# Patient Record
Sex: Female | Born: 1975 | Race: White | Hispanic: No | Marital: Married | Smoking: Never smoker
Health system: Southern US, Community
[De-identification: ages and names within clinical notes are randomized; demographics above are authoritative.]

## PROBLEM LIST (undated history)

## (undated) DIAGNOSIS — G43909 Migraine, unspecified, not intractable, without status migrainosus: Secondary | ICD-10-CM

## (undated) DIAGNOSIS — J45909 Unspecified asthma, uncomplicated: Secondary | ICD-10-CM

## (undated) DIAGNOSIS — Z1371 Encounter for nonprocreative screening for genetic disease carrier status: Secondary | ICD-10-CM

## (undated) DIAGNOSIS — I739 Peripheral vascular disease, unspecified: Secondary | ICD-10-CM

## (undated) HISTORY — PX: MOLE REMOVAL: SHX2046

## (undated) HISTORY — DX: Migraine, unspecified, not intractable, without status migrainosus: G43.909

## (undated) HISTORY — PX: TONSILLECTOMY: SUR1361

## (undated) HISTORY — DX: Encounter for nonprocreative screening for genetic disease carrier status: Z13.71

---

## 2004-12-31 ENCOUNTER — Ambulatory Visit (HOSPITAL_COMMUNITY): Admission: RE | Admit: 2004-12-31 | Discharge: 2004-12-31 | Payer: Self-pay | Admitting: Gynecology

## 2005-03-05 ENCOUNTER — Ambulatory Visit (HOSPITAL_COMMUNITY): Admission: RE | Admit: 2005-03-05 | Discharge: 2005-03-05 | Payer: Self-pay | Admitting: Gynecology

## 2005-05-25 ENCOUNTER — Inpatient Hospital Stay (HOSPITAL_COMMUNITY): Admission: AD | Admit: 2005-05-25 | Discharge: 2005-05-27 | Payer: Self-pay | Admitting: Gynecology

## 2007-02-24 ENCOUNTER — Ambulatory Visit: Payer: Self-pay | Admitting: Obstetrics & Gynecology

## 2012-12-29 DIAGNOSIS — G43909 Migraine, unspecified, not intractable, without status migrainosus: Secondary | ICD-10-CM

## 2012-12-29 HISTORY — DX: Migraine, unspecified, not intractable, without status migrainosus: G43.909

## 2013-03-29 DIAGNOSIS — Z1371 Encounter for nonprocreative screening for genetic disease carrier status: Secondary | ICD-10-CM

## 2013-03-29 HISTORY — DX: Encounter for nonprocreative screening for genetic disease carrier status: Z13.71

## 2016-05-29 ENCOUNTER — Encounter: Payer: Self-pay | Admitting: Emergency Medicine

## 2016-05-29 DIAGNOSIS — N939 Abnormal uterine and vaginal bleeding, unspecified: Secondary | ICD-10-CM | POA: Diagnosis present

## 2016-05-29 DIAGNOSIS — N920 Excessive and frequent menstruation with regular cycle: Secondary | ICD-10-CM | POA: Insufficient documentation

## 2016-05-29 LAB — CBC
HEMATOCRIT: 40.4 % (ref 35.0–47.0)
Hemoglobin: 13.3 g/dL (ref 12.0–16.0)
MCH: 26.6 pg (ref 26.0–34.0)
MCHC: 33.1 g/dL (ref 32.0–36.0)
MCV: 80.3 fL (ref 80.0–100.0)
Platelets: 242 10*3/uL (ref 150–440)
RBC: 5.02 MIL/uL (ref 3.80–5.20)
RDW: 13.8 % (ref 11.5–14.5)
WBC: 10.8 10*3/uL (ref 3.6–11.0)

## 2016-05-29 LAB — BASIC METABOLIC PANEL
Anion gap: 9 (ref 5–15)
BUN: 17 mg/dL (ref 6–20)
CALCIUM: 9.3 mg/dL (ref 8.9–10.3)
CO2: 30 mmol/L (ref 22–32)
CREATININE: 0.78 mg/dL (ref 0.44–1.00)
Chloride: 99 mmol/L — ABNORMAL LOW (ref 101–111)
GFR calc non Af Amer: >60 mL/min (ref >60)
Glucose, Bld: 96 mg/dL (ref 65–99)
Potassium: 3.8 mmol/L (ref 3.5–5.1)
SODIUM: 138 mmol/L (ref 135–145)

## 2016-05-29 LAB — POCT PREGNANCY, URINE: Preg Test, Ur: NEGATIVE

## 2016-05-29 NOTE — ED Notes (Signed)
Vaginal bleeding.  Onset last PM.  C/O lower abdominal cramping.  Last menstral period 2 weeks ago.  States bleeding is heavy.  Over one hour patient has bled through a super plus tampon and large pad.  Also passing clots.

## 2016-05-30 ENCOUNTER — Emergency Department
Admission: EM | Admit: 2016-05-30 | Discharge: 2016-05-30 | Disposition: A | Discharge status: Home / Self Care | Payer: Managed Care, Other (non HMO) | Attending: Emergency Medicine | Admitting: Emergency Medicine

## 2016-05-30 ENCOUNTER — Emergency Department: Payer: Managed Care, Other (non HMO)

## 2016-05-30 DIAGNOSIS — N920 Excessive and frequent menstruation with regular cycle: Secondary | ICD-10-CM

## 2016-05-30 MED ORDER — OXYCODONE-ACETAMINOPHEN 5-325 MG PO TABS
1.0000 | ORAL_TABLET | Freq: Once | ORAL | Status: AC
  Administered 2016-05-30: 1 via ORAL

## 2016-05-30 MED ORDER — MEDROXYPROGESTERONE ACETATE 10 MG PO TABS: 20.0000 mg | ORAL_TABLET | Freq: Three times a day (TID) | ORAL | Status: DC

## 2016-05-30 MED ORDER — OXYCODONE-ACETAMINOPHEN 5-325 MG PO TABS
ORAL_TABLET | ORAL | Status: AC
  Administered 2016-05-30: 1 via ORAL
  Filled 2016-05-30: qty 1

## 2016-05-30 MED ORDER — MEDROXYPROGESTERONE ACETATE 10 MG PO TABS
20.0000 mg | ORAL_TABLET | Freq: Once | ORAL | Status: AC
  Administered 2016-05-30: 20 mg via ORAL
  Filled 2016-05-30: qty 2

## 2016-05-30 NOTE — ED Notes (Signed)
Pt reports having bleeding since yesterday that is much heavier than her normal periods.  Pt states that she has saturated 7-10 super plus tampons in the past 24 hours.  Pt reports having discomfort in lower abdomen as well.  Pt instructed to change clothing and is awaiting ultrasound tech at this time.

## 2016-05-30 NOTE — ED Provider Notes (Signed)
[facility] Emergency Department Provider Note  ____________________________________________  Time seen: 12:15 AM  I have reviewed the triage vital signs and the nursing notes.   HISTORY  Chief Complaint Vaginal Bleeding      HPI Diane Reynolds is a 40 y.o. female presents with heavy vaginal bleeding times one day. Patient states she's contrary 8 or 9 superabsorbent tampons today as well as pads. Patient denies any weakness numbness gait instability. Patient does admit to low abdominal cramping. Patient admits to 20 year history of birth control which she discontinued approximately 9 months ago.    Past medical history No pertinent past medical history There are no active problems to display for this patient.   Past surgical history None No current outpatient prescriptions on file.  Allergies Azithromycin  No family history on file.  Social History Social History  Substance Use Topics  . Smoking status: Never Smoker   . Smokeless tobacco: None  . Alcohol Use: None    Review of Systems  Constitutional: Negative for fever. Eyes: Negative for visual changes. ENT: Negative for sore throat. Cardiovascular: Negative for chest pain. Respiratory: Negative for shortness of breath. Gastrointestinal: Negative for abdominal pain, vomiting and diarrhea. Genitourinary: Negative for dysuria. Musculoskeletal: Negative for back pain. Skin: Negative for rash. Neurological: Negative for headaches, focal weakness or numbness.   10-point ROS otherwise negative.  ____________________________________________   PHYSICAL EXAM:  VITAL SIGNS: ED Triage Vitals  Enc Vitals Group     BP 05/29/16 2235 124/57 mmHg     Pulse Rate 05/29/16 2235 90     Resp 05/29/16 2235 16     Temp 05/29/16 2235 98.6 F (37 C)     Temp Source 05/29/16 2235 Oral     SpO2 05/29/16 2235 98 %     Weight 05/29/16 2235 234 lb (106.142 kg)     Height 05/29/16 2235 5\' 8"   (1.727 m)     Head Cir --      Peak Flow --      Pain Score 05/29/16 2237 6     Pain Loc --      Pain Edu? --      Excl. in [place]? --      Constitutional: Alert and oriented. Well appearing and in no distress. Eyes: Conjunctivae are normal. PERRL. Normal extraocular movements. ENT   Head: Normocephalic and atraumatic.   Nose: No congestion/rhinnorhea.   Mouth/Throat: Mucous membranes are moist.   Neck: No stridor. Hematological/Lymphatic/Immunilogical: No cervical lymphadenopathy. Cardiovascular: Normal rate, regular rhythm. Normal and symmetric distal pulses are present in all extremities. No murmurs, rubs, or gallops. Respiratory: Normal respiratory effort without tachypnea nor retractions. Breath sounds are clear and equal bilaterally. No wheezes/rales/rhonchi. Gastrointestinal: Soft and nontender. No distention. There is no CVA tenderness. Genitourinary: deferred Musculoskeletal: Nontender with normal range of motion in all extremities. No joint effusions.  No lower extremity tenderness nor edema. Neurologic:  Normal speech and language. No gross focal neurologic deficits are appreciated. Speech is normal.  Skin:  Skin is warm, dry and intact. No rash noted. Psychiatric: Mood and affect are normal. Speech and behavior are normal. Patient exhibits appropriate insight and judgment.  ____________________________________________    LABS (pertinent positives/negatives)  Labs Reviewed  BASIC METABOLIC PANEL - Abnormal; Notable for the following:    Chloride 99 (*)    All other components within normal limits  CBC  POC URINE PREG, ED  POCT PREGNANCY, URINE      RADIOLOGY  US  Pelvis Complete (Final result) Result time: 05/30/16 01:35:32   Final result by Rad Results In Interface (05/30/16 01:35:32)   Narrative:   CLINICAL DATA: Menorrhagia.  EXAM: TRANSABDOMINAL AND TRANSVAGINAL ULTRASOUND OF PELVIS  TECHNIQUE: Both transabdominal and transvaginal  ultrasound examinations of the pelvis were performed. Transabdominal technique was performed for global imaging of the pelvis including uterus, ovaries, adnexal regions, and pelvic cul-de-sac. It was necessary to proceed with endovaginal exam following the transabdominal exam to visualize the uterus, endometrium, ovaries and adnexa.  COMPARISON: None  FINDINGS: Uterus  Measurements: 8.5 x 4.0 x 4.8 cm. No fibroids or other mass visualized.  Endometrium  Thickness: 16.5 mm, mildly thickened and heterogeneous. No focal abnormality visualized.  Right ovary  Measurements: 4.3 x 2.1 x 2.7 cm. Normal appearance/no adnexal mass. Physiologic follicles, normal blood flow.  Left ovary  Measurements: 3.5 x 2.0 x 2.6 cm. Normal appearance/no adnexal mass. Physiologic follicles, normal blood flow.  Other findings  No abnormal free fluid.  IMPRESSION: 1. Thickened heterogeneous endometrium. If bleeding remains unresponsive to hormonal or medical therapy, focal lesion work-up with sonohysterogram should be considered. Endometrial biopsy should also be considered in pre-menopausal patients at high risk for endometrial carcinoma. (Ref: Radiological Reasoning: Algorithmic Workup of Abnormal Vaginal Bleeding with Endovaginal Sonography and Sonohysterography. AJR 2008; [id number]) 2. Normal sonographic appearance of both ovaries.   Electronically Signed By: Jeb Levering M.D. On: 05/30/2016 01:35          US Transvaginal Non-OB (Final result) Result time: 05/30/16 01:35:32   Final result by Rad Results In Interface (05/30/16 01:35:32)   Narrative:   CLINICAL DATA: Menorrhagia.  EXAM: TRANSABDOMINAL AND TRANSVAGINAL ULTRASOUND OF PELVIS  TECHNIQUE: Both transabdominal and transvaginal ultrasound examinations of the pelvis were performed. Transabdominal technique was performed for global imaging of the pelvis including uterus, ovaries, adnexal regions, and  pelvic cul-de-sac. It was necessary to proceed with endovaginal exam following the transabdominal exam to visualize the uterus, endometrium, ovaries and adnexa.  COMPARISON: None  FINDINGS: Uterus  Measurements: 8.5 x 4.0 x 4.8 cm. No fibroids or other mass visualized.  Endometrium  Thickness: 16.5 mm, mildly thickened and heterogeneous. No focal abnormality visualized.  Right ovary  Measurements: 4.3 x 2.1 x 2.7 cm. Normal appearance/no adnexal mass. Physiologic follicles, normal blood flow.  Left ovary  Measurements: 3.5 x 2.0 x 2.6 cm. Normal appearance/no adnexal mass. Physiologic follicles, normal blood flow.  Other findings  No abnormal free fluid.  IMPRESSION: 1. Thickened heterogeneous endometrium. If bleeding remains unresponsive to hormonal or medical therapy, focal lesion work-up with sonohysterogram should be considered. Endometrial biopsy should also be considered in pre-menopausal patients at high risk for endometrial carcinoma. (Ref: Radiological Reasoning: Algorithmic Workup of Abnormal Vaginal Bleeding with Endovaginal Sonography and Sonohysterography. AJR 2008; [id number]) 2. Normal sonographic appearance of both ovaries.   Electronically Signed By: Jeb Levering M.D. On: 05/30/2016 01:35           INITIAL IMPRESSION / ASSESSMENT AND PLAN / ED COURSE  Pertinent labs & imaging results that were available during my care of the patient were reviewed by me and considered in my medical decision making (see chart for details).  Patient discussed with Dr. Georgianne Fick GYN on call recommendation for Provera 20 mg 3 times a day times one week. Patient will follow up with Dr. Georgianne Fick in one week. I discussed this with the patient and her husband who are in agreement with plan.  ____________________________________________   FINAL CLINICAL IMPRESSION(S) / ED DIAGNOSES  Final diagnoses:  Menorrhagia      Gregor Hams, MD 05/30/16  [phone]

## 2016-05-30 NOTE — ED Notes (Signed)
Pt discharged to home.  Family member driving.  Discharge instructions reviewed.  Verbalized understanding.  No questions or concerns at this time.  Teach back verified.  Pt in NAD.  No items left in ED.   

## 2016-05-30 NOTE — Discharge Instructions (Signed)

## 2017-03-02 ENCOUNTER — Ambulatory Visit (INDEPENDENT_AMBULATORY_CARE_PROVIDER_SITE_OTHER): Payer: Managed Care, Other (non HMO) | Admitting: Obstetrics and Gynecology

## 2017-03-02 ENCOUNTER — Encounter: Payer: Self-pay | Admitting: Obstetrics and Gynecology

## 2017-03-02 VITALS — BP 110/66 | HR 63 | Ht 68.0 in | Wt 231.0 lb

## 2017-03-02 DIAGNOSIS — E669 Obesity, unspecified: Secondary | ICD-10-CM

## 2017-03-02 DIAGNOSIS — Z6835 Body mass index (BMI) 35.0-35.9, adult: Secondary | ICD-10-CM

## 2017-03-02 MED ORDER — PHENTERMINE HCL 37.5 MG PO TABS: 37.5000 mg | ORAL_TABLET | Freq: Every day | ORAL | 0 refills | Status: DC

## 2017-03-02 NOTE — Progress Notes (Signed)
Gynecology Office Visit  Chief Complaint:  Chief Complaint  Patient presents with  . Weight Check    History of Present Illness: [name] a 41 y.o. G4P2002 female, who presents for the evaluation of the desire to lose weight. She has lost 10 pounds. The patient states the following symptoms since starting her weight loss therapy: appetite suppression, energy, and weight loss.  The patient also reports no other ill effects. The patient specifically denies heart palpitations, anxiety, and insomnia.   Review of Systems: Review of Systems  Constitutional: Positive for weight loss. Negative for diaphoresis and malaise/fatigue.  Cardiovascular: Negative for chest pain, palpitations and orthopnea.  Gastrointestinal: Negative for abdominal pain, constipation and diarrhea.  Endo/Heme/Allergies: Positive for polydipsia.  Psychiatric/Behavioral: Negative for depression. The patient is not nervous/anxious.     Past Medical History:  Past Medical History:  Diagnosis Date  . BRCA negative   . Migraine 2014    Past Surgical History:  Past Surgical History:  Procedure Laterality Date  . MOLE REMOVAL  08/2009,09/2009   Biopsy compound Nevus w/slight-moderate melanocyte Atypia pubic region, excision for negative margins  . TONSILLECTOMY      Gynecologic History: Patient's last menstrual period was 05/30/2016.  Obstetric History: [id number]  Family History:  Family History  Problem Relation Age of Onset  . Breast cancer Mother 37  . Melanoma Mother 19    Malignant skin  . Breast cancer Maternal Aunt 87  . Ovarian cancer Other 19    Social History:  Social History   Social History  . Marital status: Married    Spouse name: N/A  . Number of children: N/A  . Years of education: N/A   Occupational History  . Not on file.   Social History Main Topics  . Smoking status: Never Smoker  . Smokeless tobacco: Never Used  . Alcohol use Not on file  . Drug use: No  . Sexual  activity: Yes   Other Topics Concern  . Not on file   Social History Narrative  . No narrative on file    Allergies:  Allergies  Allergen Reactions  . Azithromycin Anaphylaxis    Medications: Prior to Admission medications   Medication Sig Start Date End Date Taking? Authorizing Provider  B Complex-C (B-COMPLEX WITH VITAMIN C) tablet Take 1 tablet by mouth daily.    Historical Provider, MD  cholecalciferol (VITAMIN D) 400 units TABS tablet Take 400 Units by mouth.    Historical Provider, MD  levonorgestrel (MIRENA) 20 MCG/24HR IUD 1 each by Intrauterine route once.    Historical Provider, MD  phentermine (ADIPEX-P) 37.5 MG tablet Take 1 tablet (37.5 mg total) by mouth daily before breakfast. 03/02/17   [name], MD    Physical Exam Vitals:  Vitals:   03/02/17 1156  BP: 110/66  Pulse: 63   Patient's last menstrual period was 05/30/2016.  General: NAD HEENT: normocephalic, anicteric Thyroid: no enlargement Pulmonary: no increased work of breathing Neurologic: Grossly intact Psychiatric: mood appropriate, affect full  Assessment: 42 y.o. [id number] No problem-specific Assessment & Plan notes found for this encounter.   Plan: Problem List Items Addressed This Visit      Other   Obesity (BMI 35.0-39.9 without comorbidity) - Primary   Relevant Medications   phentermine (ADIPEX-P) 37.5 MG tablet    Other Visit Diagnoses    BMI 35.0-35.9,adult          1) 1800 Calorie ADA Diet  2) Patient education given regarding appropriate lifestyle  changes for weight loss including: regular physical activity, healthy coping strategies, caloric restriction and healthy eating patterns.  3) Patient will be started on weight loss medication. The risks and benefits and side effects of medication, such as Adipex (Phenteramine) ,  Tenuate (Diethylproprion), Belviq (lorcarsin), Contrave (buproprion/naltrexone), Qsymia (phentermine/topiramate), and Saxenda (liraglutide) is discussed.  The pros and cons of suppressing appetite and boosting metabolism is discussed. Risks of tolerence and addiction is discussed for selected agents discussed. Use of medicine will ne short term, such as 3-4 months at a time followed by a period of time off of the medicine to avoid these risks and side effects for Adipex, Qsymia, and Tenuate discussed. Pt to call with any negative side effects and agrees to keep follow up appts.  4) Patient to take medication, with the benefits of appetite suppression and metabolism boost d/w pt, along with the side effects and risk factors of long term use that will be avoided with our use of short bursts of therapy. Rx provided.    5) 15 minutes face-to-face; with counseling/coordination of care > 50 percent of visit related to obesity and ongoing management/treatment   6) Follow up in 4 weeks to assess response

## 2017-03-02 NOTE — Patient Instructions (Signed)

## 2017-04-08 ENCOUNTER — Encounter: Payer: Self-pay | Admitting: Obstetrics and Gynecology

## 2017-04-08 ENCOUNTER — Ambulatory Visit (INDEPENDENT_AMBULATORY_CARE_PROVIDER_SITE_OTHER): Payer: Managed Care, Other (non HMO) | Admitting: Obstetrics and Gynecology

## 2017-04-08 VITALS — BP 108/72 | HR 66 | Ht 68.5 in | Wt 226.0 lb

## 2017-04-08 DIAGNOSIS — N946 Dysmenorrhea, unspecified: Secondary | ICD-10-CM

## 2017-04-08 DIAGNOSIS — E669 Obesity, unspecified: Secondary | ICD-10-CM | POA: Diagnosis not present

## 2017-04-08 DIAGNOSIS — Z6833 Body mass index (BMI) 33.0-33.9, adult: Secondary | ICD-10-CM

## 2017-04-08 DIAGNOSIS — N920 Excessive and frequent menstruation with regular cycle: Secondary | ICD-10-CM | POA: Diagnosis not present

## 2017-04-08 MED ORDER — PHENTERMINE HCL 37.5 MG PO TABS: 37.5000 mg | ORAL_TABLET | Freq: Every day | ORAL | 0 refills | Status: DC

## 2017-04-08 NOTE — Progress Notes (Signed)
Gynecology Office Visit  Chief Complaint:  Chief Complaint  Patient presents with  . Weight Check    History of Present Illness: [name] a 41 y.o. G72P2002 female, who presents for the evaluation of the desire to lose weight. She has lost 5 pounds for 2 month total of 15lbs. The patient states the following symptoms since starting her weight loss therapy: appetite suppression, energy, and weight loss.  The patient also reports no other ill effects. The patient specifically denies heart palpitations, anxiety, and insomnia.   She is still having menorrhagia and dysmenorrhea despite having an appropriately position Mirena IUD, however, with weight loss states has noted some improvement in overall flow and discomfort.  Review of Systems: 10 point review of systems negative unless otherwise noted in HPI  Past Medical History:  Past Medical History:  Diagnosis Date  . BRCA negative   . Migraine 2014    Past Surgical History:  Past Surgical History:  Procedure Laterality Date  . MOLE REMOVAL  08/2009,09/2009   Biopsy compound Nevus w/slight-moderate melanocyte Atypia pubic region, excision for negative margins  . TONSILLECTOMY      Gynecologic History: Patient's last menstrual period was 03/16/2017 (exact date).  Obstetric History: [id number]  Family History:  Family History  Problem Relation Age of Onset  . Breast cancer Mother 14  . Melanoma Mother 7    Malignant skin  . Breast cancer Maternal Aunt 66  . Ovarian cancer Other 60    Social History:  Social History   Social History  . Marital status: Married    Spouse name: N/A  . Number of children: N/A  . Years of education: N/A   Occupational History  . Not on file.   Social History Main Topics  . Smoking status: Never Smoker  . Smokeless tobacco: Never Used  . Alcohol use Not on file  . Drug use: No  . Sexual activity: Yes    Birth control/ protection: IUD   Other Topics Concern  . Not on file    Social History Narrative  . No narrative on file    Allergies:  Allergies  Allergen Reactions  . Azithromycin Anaphylaxis    Medications: Prior to Admission medications   Medication Sig Start Date End Date Taking? Authorizing Provider  B Complex-C (B-COMPLEX WITH VITAMIN C) tablet Take 1 tablet by mouth daily.    Historical Provider, MD  cholecalciferol (VITAMIN D) 400 units TABS tablet Take 400 Units by mouth.    Historical Provider, MD  levonorgestrel (MIRENA) 20 MCG/24HR IUD 1 each by Intrauterine route once.    Historical Provider, MD  phentermine (ADIPEX-P) 37.5 MG tablet Take 1 tablet (37.5 mg total) by mouth daily before breakfast. 03/02/17   [name], MD    Physical Exam Vitals:  Vitals:   04/08/17 0809  BP: 108/72  Pulse: 66   Patient's last menstrual period was 03/16/2017 (exact date). Filed Weights   04/08/17 0809  Weight: 226 lb (102.5 kg)   Body mass index is 33.86 kg/m.   General: NAD HEENT: normocephalic, anicteric Thyroid: no enlargement Pulmonary: no increased work of breathing Neurologic: Grossly intact Psychiatric: mood appropriate, affect full  Assessment: 41 y.o. [id number] No problem-specific Assessment & Plan notes found for this encounter.   Plan: Problem List Items Addressed This Visit      Other   Obesity (BMI 35.0-39.9 without comorbidity) - Primary   Relevant Medications   phentermine (ADIPEX-P) 37.5 MG tablet    Other Visit  Diagnoses    BMI 33.0-33.9,adult       Menorrhagia with regular cycle       Dysmenorrhea          1) 1500 Calorie ADA Diet  2) Patient education given regarding appropriate lifestyle changes for weight loss including: regular physical activity, healthy coping strategies, caloric restriction and healthy eating patterns.  3) Patient will be started on weight loss medication. The risks and benefits and side effects of medication, such as Adipex (Phenteramine) ,  Tenuate (Diethylproprion), Belviq  (lorcarsin), Contrave (buproprion/naltrexone), Qsymia (phentermine/topiramate), and Saxenda (liraglutide) is discussed. The pros and cons of suppressing appetite and boosting metabolism is discussed. Risks of tolerence and addiction is discussed for selected agents discussed. Use of medicine will ne short term, such as 3-4 months at a time followed by a period of time off of the medicine to avoid these risks and side effects for Adipex, Qsymia, and Tenuate discussed. Pt to call with any negative side effects and agrees to keep follow up appts.  4) Patient to take medication, with the benefits of appetite suppression and metabolism boost d/w pt, along with the side effects and risk factors of long term use that will be avoided with our use of short bursts of therapy. Rx provided.    5) 15 minutes face-to-face; with counseling/coordination of care > 50 percent of visit related to obesity and ongoing management/treatment   6) Follow up in 4 weeks to assess response, Patient would also like to go ahead and schedule TL[facility] for her continued menstrual issues which have been unresponsive to medical therapy.

## 2017-05-06 ENCOUNTER — Ambulatory Visit: Payer: Managed Care, Other (non HMO) | Admitting: Obstetrics and Gynecology

## 2017-06-29 ENCOUNTER — Encounter: Payer: Managed Care, Other (non HMO) | Admitting: Obstetrics and Gynecology

## 2017-06-29 ENCOUNTER — Other Ambulatory Visit: Payer: Managed Care, Other (non HMO)

## 2017-07-14 ENCOUNTER — Ambulatory Visit: Admit: 2017-07-14 | Payer: Managed Care, Other (non HMO) | Admitting: Obstetrics and Gynecology

## 2017-07-14 SURGERY — HYSTERECTOMY, TOTAL, LAPAROSCOPIC
Anesthesia: Choice

## 2018-07-13 ENCOUNTER — Encounter (INDEPENDENT_AMBULATORY_CARE_PROVIDER_SITE_OTHER): Payer: Self-pay | Admitting: Vascular Surgery

## 2018-07-13 ENCOUNTER — Ambulatory Visit (INDEPENDENT_AMBULATORY_CARE_PROVIDER_SITE_OTHER): Payer: Managed Care, Other (non HMO) | Admitting: Vascular Surgery

## 2018-07-13 VITALS — BP 97/59 | HR 67 | Resp 18 | Ht 68.0 in | Wt 231.8 lb

## 2018-07-13 DIAGNOSIS — D1800 Hemangioma unspecified site: Secondary | ICD-10-CM

## 2018-07-13 DIAGNOSIS — M79604 Pain in right leg: Secondary | ICD-10-CM

## 2018-07-13 DIAGNOSIS — M79609 Pain in unspecified limb: Secondary | ICD-10-CM | POA: Insufficient documentation

## 2018-07-13 DIAGNOSIS — M79605 Pain in left leg: Secondary | ICD-10-CM | POA: Diagnosis not present

## 2018-07-13 NOTE — Assessment & Plan Note (Signed)
The patient has multiple, symptomatic glomus tumors as described above.  We have discussed with her that the primary treatment for these as resection.  Injections can be considered, but tend to not be as reliably effective.  We have discussed that any ones that are causing significant symptoms can be resected.  These are generally benign and do not need to be resected if they are asymptomatic.  She desires to have these removed.  I have asked her to mark the ones that she would like to have removed and she says she thinks she would like the right shoulder, the right wrist and forearm, the left posterior knee, and the right posterior thigh lesions removed.  This would be done with anesthesia at the hospital.  This would be an outpatient procedure.  Risks and benefits were discussed.

## 2018-07-13 NOTE — Assessment & Plan Note (Signed)
The patient describes symptoms that are consistent with venous insufficiency of the lower extremities.  We discussed the pathophysiology and natural history of venous disease.  We discussed compression, elevation, and anti-inflammatories as needed for pain.  We can check a venous reflux study in several months to assess her venous anatomy and determine further treatment options.  We told her that this is separate from her glomus tumor issues.

## 2018-07-13 NOTE — Progress Notes (Signed)
Patient ID: [name], female   DOB: [birth year], 42 y.o.   MRN: [id number]  Chief Complaint  Patient presents with  . New Patient (Initial Visit)    ref for glomus tumor   HPI Diane Reynolds is a 42 y.o. female.  I am asked to see the patient by Dr. 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  The patient reports multiple small superficial lesions that are painful and look like bruises.  She says she had a father and an uncle who had glomus tumors and her dermatologist told her that switch she had as well.  The one on her right shoulder is very painful.  She also has painful ones on her right posterior thigh, left posterior knee, as well as her right wrist and forearm.  She says she has a lot of little tiny ones that are not that painful.  She has never had these resected.  She has had consideration for that for many years.  No episodes of bleeding.  These are not dramatically enlarging. The patient also reports discoloration and swelling in her legs bilaterally.  She has noticed this more with more activity and being on her feet more.  This has been a steadily progressive problem over months to years.  No history of ulceration or infection.  No history of DVT or superficial thrombophlebitis.   Past Medical History:  Diagnosis Date  . BRCA negative   . Migraine 2014    Past Surgical History:  Procedure Laterality Date  . MOLE REMOVAL  08/2009,09/2009   Biopsy compound Nevus w/slight-moderate melanocyte Atypia pubic region, excision for negative margins  . TONSILLECTOMY      Family History  Problem Relation Age of Onset  . Breast cancer Mother 19  . Melanoma Mother 73       Malignant skin  . Breast cancer Maternal Aunt 55  . Ovarian cancer Other 25  Father and uncle with glomus tumors  Social History Social History   Tobacco Use  . Smoking status: Never Smoker  . Smokeless tobacco: Never Used  Substance Use Topics  . Alcohol use: Not on file  . Drug use: No      Allergies  Allergen Reactions  . Azithromycin Anaphylaxis    Current Outpatient Medications  Medication Sig Dispense Refill  . B Complex-C (B-COMPLEX WITH VITAMIN C) tablet Take 1 tablet by mouth daily.    . cholecalciferol (VITAMIN D) 400 units TABS tablet Take 400 Units by mouth.    . levonorgestrel (MIRENA) 20 MCG/24HR IUD 1 each by Intrauterine route once.    . TESTOSTERONE COMPOUNDING KIT 20 % CREA Place onto the skin daily.    . phentermine (ADIPEX-P) 37.5 MG tablet Take 1 tablet (37.5 mg total) by mouth daily before breakfast. (Patient not taking: Reported on 07/13/2018) 30 tablet 0   No current facility-administered medications for this visit.       REVIEW OF SYSTEMS (Negative unless checked)  Constitutional: _0 Weight loss  _1 Fever  _2 Chills Cardiac: _3 Chest pain   _4 Chest pressure   _5 Palpitations   _6 Shortness of breath when laying flat   _7 Shortness of breath at rest   _8 Shortness of breath with exertion. Vascular:  _9 Pain in legs with walking   _10 Pain in legs at rest   _11 Pain in legs when laying flat   _12 Claudication   _13 Pain in feet when walking  _14 Pain in feet at rest  _15 Pain in feet when laying flat   _16 History of DVT   _17   Phlebitis   _0 Swelling in legs   _1 Varicose veins   _2 Non-healing ulcers Pulmonary:   _3 Uses home oxygen   _4 Productive cough   _5 Hemoptysis   _6 Wheeze  _7 COPD   _8 Asthma Neurologic:  _9 Dizziness  _10 Blackouts   _11 Seizures   _12 History of stroke   _13 History of TIA  _14 Aphasia   _15 Temporary blindness   _16 Dysphagia   _17 Weakness or numbness in arms   _18 Weakness or numbness in legs Musculoskeletal:  _19 Arthritis   _20 Joint swelling   _21 Joint pain   _22 Low back pain Hematologic:  _23 Easy bruising  _24 Easy bleeding   _25 Hypercoagulable state   _26 Anemic  _27 Hepatitis Gastrointestinal:  _28 Blood in stool   _29 Vomiting blood  _30 Gastroesophageal reflux/heartburn   _31 Abdominal pain Genitourinary:  _32 Chronic kidney disease   _33 Difficult urination  _34 Frequent urination   _35 Burning with urination   _36 Hematuria Skin:  _37 Rashes   _38 Ulcers   _39 Wounds Psychological:  _40 History of anxiety   _41  History of major depression.    Physical Exam BP (!) 97/59 (BP Location: Right Arm)   Pulse 67   Resp 18   Ht _42  (1.727 m)   Wt 231 lb 12.8 oz (105.1 kg)   BMI 35.25 kg/m  Gen:  WD/WN, NAD Head: [place]/AT, No temporalis wasting. Prominent temp pulse not noted. Ear/Nose/Throat: Hearing grossly intact, nares w/o erythema or drainage, oropharynx w/o Erythema/Exudate Eyes: Conjunctiva clear, sclera non-icteric  Neck: trachea midline.  Pulmonary:  Good air movement, respirations not labored, no use of accessory muscles Cardiac: RRR, no JVD Vascular:  Vessel Right Left  Radial Palpable Palpable                          PT Palpable Palpable  DP Palpable Palpable   Gastrointestinal: soft, non-tender/non-distended. Musculoskeletal: M/S 5/5 throughout.  Extremities without ischemic changes.  No deformity or atrophy.  Trace lower extremity edema. Neurologic: Sensation grossly intact in extremities.  Symmetrical.  Speech is fluent. Motor exam as listed above. Psychiatric: Judgment intact, Mood & affect appropriate for pt's clinical situation. Dermatologic: The patient has numerous small areas that look like oozes or venous malformations.  These are consistent with glomus tumors.  There is one on the medial right wrist, the shoulder area on the right, the posterior right thigh, the posterior left knee.  These are the most prominent ones.  These are also the ones that cause her the most pain.    Radiology No results found.  Labs No results found for this or any previous visit (from the past 2160 hour(s)).  Assessment/Plan:  Pain in limb The patient describes symptoms that are consistent with venous insufficiency of the lower extremities.  We discussed the pathophysiology and natural history of venous disease.  We discussed compression, elevation, and  anti-inflammatories as needed for pain.  We can check a venous reflux study in several months to assess her venous anatomy and determine further treatment options.  We told her that this is separate from her glomus tumor issues.  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  We have discussed with her that the primary treatment for these as resection.  Injections can be considered, but tend to not be as reliably effective.  We have discussed that any ones that are causing significant symptoms can be resected.  These are generally benign and do not need to be resected if they are asymptomatic.  She desires to have these removed.  I have asked her to  mark the ones that she would like to have removed and she says she thinks she would like the right shoulder, the right wrist and forearm, the left posterior knee, and the right posterior thigh lesions removed.  This would be done with anesthesia at the hospital.  This would be an outpatient procedure.  Risks and benefits were discussed.      Leotis Pain 07/13/2018, 9:55 AM   This note was created with Dragon medical transcription system.  Any errors from dictation are unintentional.

## 2018-07-13 NOTE — Patient Instructions (Signed)

## 2018-07-14 ENCOUNTER — Telehealth (INDEPENDENT_AMBULATORY_CARE_PROVIDER_SITE_OTHER): Payer: Self-pay

## 2018-07-14 ENCOUNTER — Encounter (INDEPENDENT_AMBULATORY_CARE_PROVIDER_SITE_OTHER): Payer: Self-pay

## 2018-07-14 NOTE — Telephone Encounter (Signed)
Attempted to contact the patient regarding her surgery and was unable to leave a message due to voicemail being full.

## 2018-07-19 ENCOUNTER — Encounter (INDEPENDENT_AMBULATORY_CARE_PROVIDER_SITE_OTHER): Payer: Self-pay

## 2018-07-19 ENCOUNTER — Telehealth (INDEPENDENT_AMBULATORY_CARE_PROVIDER_SITE_OTHER): Payer: Self-pay | Admitting: Vascular Surgery

## 2018-07-19 NOTE — Telephone Encounter (Signed)
I spoke with the patient and stated that she wanted to reschedule her surgery for after Labor Day and I inform Mickel Baas with information and she will reschedule and mail it to the patient

## 2018-07-19 NOTE — Telephone Encounter (Signed)
Patient left a voice message stating that she hasnt heard anything about her surgery appt. She can be reached at [phone].

## 2018-07-20 ENCOUNTER — Inpatient Hospital Stay: Admission: RE | Admit: 2018-07-20 | Payer: Managed Care, Other (non HMO) | Source: Ambulatory Visit

## 2018-08-02 ENCOUNTER — Other Ambulatory Visit: Payer: Managed Care, Other (non HMO)

## 2018-08-20 ENCOUNTER — Other Ambulatory Visit (INDEPENDENT_AMBULATORY_CARE_PROVIDER_SITE_OTHER): Payer: Self-pay | Admitting: Nurse Practitioner

## 2018-09-02 ENCOUNTER — Other Ambulatory Visit: Payer: Self-pay

## 2018-09-02 ENCOUNTER — Encounter
Admission: RE | Admit: 2018-09-02 | Discharge: 2018-09-02 | Disposition: A | Discharge status: Home / Self Care | Payer: Managed Care, Other (non HMO) | Source: Ambulatory Visit | Attending: Vascular Surgery | Admitting: Vascular Surgery

## 2018-09-02 ENCOUNTER — Telehealth (INDEPENDENT_AMBULATORY_CARE_PROVIDER_SITE_OTHER): Payer: Self-pay

## 2018-09-02 DIAGNOSIS — Z01818 Encounter for other preprocedural examination: Secondary | ICD-10-CM | POA: Insufficient documentation

## 2018-09-02 HISTORY — DX: Peripheral vascular disease, unspecified: I73.9

## 2018-09-02 HISTORY — DX: Unspecified asthma, uncomplicated: J45.909

## 2018-09-02 LAB — PROTIME-INR
INR: 0.95
Prothrombin Time: 12.6 seconds (ref 11.4–15.2)

## 2018-09-02 LAB — BASIC METABOLIC PANEL
Anion gap: 7 (ref 5–15)
BUN: 20 mg/dL (ref 6–20)
CO2: 30 mmol/L (ref 22–32)
CREATININE: 0.73 mg/dL (ref 0.44–1.00)
Calcium: 9.2 mg/dL (ref 8.9–10.3)
Chloride: 104 mmol/L (ref 98–111)
Glucose, Bld: 106 mg/dL — ABNORMAL HIGH (ref 70–99)
Potassium: 3.2 mmol/L — ABNORMAL LOW (ref 3.5–5.1)
SODIUM: 141 mmol/L (ref 135–145)

## 2018-09-02 LAB — TYPE AND SCREEN
ABO/RH(D): A NEG
Antibody Screen: NEGATIVE

## 2018-09-02 LAB — CBC WITH DIFFERENTIAL/PLATELET
BASOS ABS: 0 10*3/uL (ref 0–0.1)
Basophils Relative: 0 %
EOS PCT: 2 %
Eosinophils Absolute: 0.1 10*3/uL (ref 0–0.7)
HCT: 38.9 % (ref 35.0–47.0)
HEMOGLOBIN: 13.3 g/dL (ref 12.0–16.0)
Lymphocytes Relative: 25 %
Lymphs Abs: 2.1 10*3/uL (ref 1.0–3.6)
MCH: 28.5 pg (ref 26.0–34.0)
MCHC: 34.2 g/dL (ref 32.0–36.0)
MCV: 83.1 fL (ref 80.0–100.0)
Monocytes Absolute: 0.4 10*3/uL (ref 0.2–0.9)
Monocytes Relative: 5 %
NEUTROS PCT: 68 %
Neutro Abs: 5.8 10*3/uL (ref 1.4–6.5)
PLATELETS: 223 10*3/uL (ref 150–440)
RBC: 4.68 MIL/uL (ref 3.80–5.20)
RDW: 14 % (ref 11.5–14.5)
WBC: 8.5 10*3/uL (ref 3.6–11.0)

## 2018-09-02 LAB — APTT: aPTT: 29 seconds (ref 24–36)

## 2018-09-02 NOTE — Pre-Procedure Instructions (Signed)
PATIENT PREOP TODAY. TAKES MULTIPLE SUPPLEMENTS. NOTIFIED [facility] IN [facility] PATIENT NEEDS TO BE CONTACTED TO VERIFY ALL MEDS. DID NOT HAVE SUPPLEMENTS. GIVEN CALL BACK NUMBER [phone].

## 2018-09-02 NOTE — Telephone Encounter (Signed)
Judeen Hammans called and stated that the patient's potassium level is at a 3.2 (low). She states that the patient admits to taking a lot of supplements. She agreed to stop taking these supplements until after her procedure.  Is there anything else she needs to be taking?

## 2018-09-02 NOTE — Pre-Procedure Instructions (Signed)
Kt 3.2 called to dr dew. HAD TO LM ON NURSES LINE.

## 2018-09-02 NOTE — Patient Instructions (Signed)
Your procedure is scheduled on: 09/09/18 Report to Day Surgery. [facility] SECOND FLOOR To find out your arrival time please call [phone] between 1PM - 3PM on 09/08/18  Remember: Instructions that are not followed completely may result in serious medical risk,  up to and including death, or upon the discretion of your surgeon and anesthesiologist your  surgery may need to be rescheduled.     _X__ 1. Do not eat food after midnight the night before your procedure.                 No gum chewing or hard candies. You may drink clear liquids up to 2 hours                 before you are scheduled to arrive for your surgery- DO not drink clear                 liquids within 2 hours of the start of your surgery.                 Clear Liquids include:  water, apple juice without pulp, clear carbohydrate                 drink such as Clearfast of Gatorade, Black Coffee or Tea (Do not add                 anything to coffee or tea).  __X__2.  On the morning of surgery brush your teeth with toothpaste and water, you                may rinse your mouth with mouthwash if you wish.  Do not swallow any toothpaste of mouthwash.     _X__ 3.  No Alcohol for 24 hours before or after surgery.   _X__ 4.  Do Not Smoke or use e-cigarettes For 24 Hours Prior to Your Surgery.                 Do not use any chewable tobacco products for at least 6 hours prior to                 surgery.  ____  5.  Bring all medications with you on the day of surgery if instructed.   ___X_  6.  Notify your doctor if there is any change in your medical condition      (cold, fever, infections).     Do not wear jewelry, make-up, hairpins, clips or nail polish. Do not wear lotions, powders, or perfumes. You may wear deodorant. Do not shave 48 hours prior to surgery. Men may shave face and neck. Do not bring valuables to the hospital.    [facility] is not responsible for any belongings or  valuables.  Contacts, dentures or bridgework may not be worn into surgery. Leave your suitcase in the car. After surgery it may be brought to your room. For patients admitted to the hospital, discharge time is determined by your treatment team.   Patients discharged the day of surgery will not be allowed to drive home.   Please read over the following fact sheets that you were given:   Surgical Site Infection Prevention    __X__ Take these medicines the morning of surgery with A SIP OF WATER:    1. NONE  2.   3.   4.  5.  6.  ____ Fleet Enema (as directed)   __X_ Use CHG Soap as directed  ____ Use inhalers on the day of surgery  ____ Stop metformin 2 days prior to surgery    ____ Take 1/2 of usual insulin dose the night before surgery. No insulin the morning          of surgery.   ____ Stop Coumadin/Plavix/aspirin on   ____ Stop Anti-inflammatories on   __X__ Stop supplements until after surgery.    STARTING 09/02/18  ____ Bring C-Pap to the hospital.

## 2018-09-08 MED ORDER — CEFAZOLIN SODIUM-DEXTROSE 2-4 GM/100ML-% IV SOLN: 2.0000 g | INTRAVENOUS | Status: DC

## 2018-09-09 ENCOUNTER — Ambulatory Visit
Admission: RE | Admit: 2018-09-09 | Discharge: 2018-09-09 | Disposition: A | Discharge status: Home / Self Care | Payer: Managed Care, Other (non HMO) | Source: Ambulatory Visit | Attending: Vascular Surgery | Admitting: Vascular Surgery

## 2018-09-09 ENCOUNTER — Encounter: Payer: Self-pay | Admitting: *Deleted

## 2018-09-09 ENCOUNTER — Other Ambulatory Visit: Payer: Self-pay

## 2018-09-09 ENCOUNTER — Encounter
Admission: RE | Disposition: A | Discharge status: Home / Self Care | Payer: Self-pay | Source: Ambulatory Visit | Attending: Vascular Surgery

## 2018-09-09 ENCOUNTER — Ambulatory Visit: Payer: Managed Care, Other (non HMO) | Admitting: Anesthesiology

## 2018-09-09 DIAGNOSIS — J45990 Exercise induced bronchospasm: Secondary | ICD-10-CM | POA: Insufficient documentation

## 2018-09-09 DIAGNOSIS — G43909 Migraine, unspecified, not intractable, without status migrainosus: Secondary | ICD-10-CM | POA: Insufficient documentation

## 2018-09-09 DIAGNOSIS — Z881 Allergy status to other antibiotic agents status: Secondary | ICD-10-CM | POA: Diagnosis not present

## 2018-09-09 DIAGNOSIS — I739 Peripheral vascular disease, unspecified: Secondary | ICD-10-CM | POA: Diagnosis not present

## 2018-09-09 DIAGNOSIS — Z803 Family history of malignant neoplasm of breast: Secondary | ICD-10-CM | POA: Insufficient documentation

## 2018-09-09 DIAGNOSIS — Z808 Family history of malignant neoplasm of other organs or systems: Secondary | ICD-10-CM | POA: Insufficient documentation

## 2018-09-09 DIAGNOSIS — J45909 Unspecified asthma, uncomplicated: Secondary | ICD-10-CM | POA: Insufficient documentation

## 2018-09-09 DIAGNOSIS — D1801 Hemangioma of skin and subcutaneous tissue: Secondary | ICD-10-CM | POA: Insufficient documentation

## 2018-09-09 DIAGNOSIS — Z8041 Family history of malignant neoplasm of ovary: Secondary | ICD-10-CM | POA: Diagnosis not present

## 2018-09-09 DIAGNOSIS — D356 Benign neoplasm of aortic body and other paraganglia: Secondary | ICD-10-CM | POA: Diagnosis not present

## 2018-09-09 HISTORY — PX: LIPOMA EXCISION: SHX5283

## 2018-09-09 LAB — POCT I-STAT 4, (NA,K, GLUC, HGB,HCT)
GLUCOSE: 93 mg/dL (ref 70–99)
HCT: 41 % (ref 36.0–46.0)
HEMOGLOBIN: 13.9 g/dL (ref 12.0–15.0)
Potassium: 3.7 mmol/L (ref 3.5–5.1)
Sodium: 141 mmol/L (ref 135–145)

## 2018-09-09 LAB — ABO/RH: ABO/RH(D): A NEG

## 2018-09-09 LAB — POCT PREGNANCY, URINE: Preg Test, Ur: NEGATIVE

## 2018-09-09 SURGERY — EXCISION LIPOMA
Anesthesia: General

## 2018-09-09 MED ORDER — MIDAZOLAM HCL 2 MG/2ML IJ SOLN
INTRAMUSCULAR | Status: AC
  Filled 2018-09-09: qty 2

## 2018-09-09 MED ORDER — HYDROCODONE-ACETAMINOPHEN 5-325 MG PO TABS
1.0000 | ORAL_TABLET | Freq: Four times a day (QID) | ORAL | Status: DC | PRN
  Administered 2018-09-09: 1 via ORAL

## 2018-09-09 MED ORDER — MIDAZOLAM HCL 2 MG/2ML IJ SOLN
INTRAMUSCULAR | Status: DC | PRN
  Administered 2018-09-09: 2 mg via INTRAVENOUS

## 2018-09-09 MED ORDER — PROPOFOL 10 MG/ML IV BOLUS
INTRAVENOUS | Status: DC | PRN
  Administered 2018-09-09: 160 mg via INTRAVENOUS

## 2018-09-09 MED ORDER — FENTANYL CITRATE (PF) 100 MCG/2ML IJ SOLN
25.0000 ug | INTRAMUSCULAR | Status: DC | PRN
  Administered 2018-09-09 (×4): 25 ug via INTRAVENOUS

## 2018-09-09 MED ORDER — ONDANSETRON HCL 4 MG/2ML IJ SOLN
INTRAMUSCULAR | Status: AC
  Filled 2018-09-09: qty 2

## 2018-09-09 MED ORDER — FENTANYL CITRATE (PF) 100 MCG/2ML IJ SOLN
INTRAMUSCULAR | Status: DC | PRN
  Administered 2018-09-09: 100 ug via INTRAVENOUS
  Administered 2018-09-09: 50 ug via INTRAVENOUS

## 2018-09-09 MED ORDER — ACETAMINOPHEN 10 MG/ML IV SOLN
INTRAVENOUS | Status: DC | PRN
  Administered 2018-09-09: 1000 mg via INTRAVENOUS

## 2018-09-09 MED ORDER — HYDROMORPHONE HCL 1 MG/ML IJ SOLN: 1.0000 mg | Freq: Once | INTRAMUSCULAR | Status: DC | PRN

## 2018-09-09 MED ORDER — FENTANYL CITRATE (PF) 100 MCG/2ML IJ SOLN
INTRAMUSCULAR | Status: AC
  Filled 2018-09-09: qty 2

## 2018-09-09 MED ORDER — ROCURONIUM BROMIDE 100 MG/10ML IV SOLN
INTRAVENOUS | Status: DC | PRN
  Administered 2018-09-09: 30 mg via INTRAVENOUS

## 2018-09-09 MED ORDER — PROPOFOL 10 MG/ML IV BOLUS
INTRAVENOUS | Status: AC
  Filled 2018-09-09: qty 20

## 2018-09-09 MED ORDER — HYDROCODONE-ACETAMINOPHEN 5-325 MG PO TABS: 1.0000 | ORAL_TABLET | Freq: Four times a day (QID) | ORAL | 0 refills | Status: DC | PRN

## 2018-09-09 MED ORDER — ONDANSETRON HCL 4 MG/2ML IJ SOLN
4.0000 mg | Freq: Once | INTRAMUSCULAR | Status: AC | PRN
  Administered 2018-09-09: 4 mg via INTRAVENOUS

## 2018-09-09 MED ORDER — FAMOTIDINE 20 MG PO TABS
20.0000 mg | ORAL_TABLET | Freq: Once | ORAL | Status: AC
  Administered 2018-09-09: 20 mg via ORAL

## 2018-09-09 MED ORDER — ACETAMINOPHEN 10 MG/ML IV SOLN
INTRAVENOUS | Status: AC
  Filled 2018-09-09: qty 100

## 2018-09-09 MED ORDER — PHENYLEPHRINE HCL 10 MG/ML IJ SOLN
INTRAMUSCULAR | Status: DC | PRN
  Administered 2018-09-09: 200 ug via INTRAVENOUS
  Administered 2018-09-09 (×2): 100 ug via INTRAVENOUS

## 2018-09-09 MED ORDER — DEXAMETHASONE SODIUM PHOSPHATE 10 MG/ML IJ SOLN
INTRAMUSCULAR | Status: DC | PRN
  Administered 2018-09-09: 5 mg via INTRAVENOUS

## 2018-09-09 MED ORDER — DEXAMETHASONE SODIUM PHOSPHATE 10 MG/ML IJ SOLN
INTRAMUSCULAR | Status: AC
  Filled 2018-09-09: qty 1

## 2018-09-09 MED ORDER — ROCURONIUM BROMIDE 50 MG/5ML IV SOLN
INTRAVENOUS | Status: AC
  Filled 2018-09-09: qty 1

## 2018-09-09 MED ORDER — LIDOCAINE HCL (CARDIAC) PF 100 MG/5ML IV SOSY
PREFILLED_SYRINGE | INTRAVENOUS | Status: DC | PRN
  Administered 2018-09-09: 100 mg via INTRAVENOUS

## 2018-09-09 MED ORDER — HYDROCODONE-ACETAMINOPHEN 5-325 MG PO TABS
ORAL_TABLET | ORAL | Status: AC
  Filled 2018-09-09: qty 1

## 2018-09-09 MED ORDER — CEFAZOLIN SODIUM-DEXTROSE 2-3 GM-%(50ML) IV SOLR
INTRAVENOUS | Status: DC | PRN
  Administered 2018-09-09: 2 g via INTRAVENOUS

## 2018-09-09 MED ORDER — FAMOTIDINE 20 MG PO TABS
ORAL_TABLET | ORAL | Status: AC
  Filled 2018-09-09: qty 1

## 2018-09-09 MED ORDER — ONDANSETRON HCL 4 MG/2ML IJ SOLN
INTRAMUSCULAR | Status: DC | PRN
  Administered 2018-09-09: 4 mg via INTRAVENOUS

## 2018-09-09 MED ORDER — ONDANSETRON HCL 4 MG/2ML IJ SOLN: 4.0000 mg | Freq: Four times a day (QID) | INTRAMUSCULAR | Status: DC | PRN

## 2018-09-09 MED ORDER — CEFAZOLIN SODIUM-DEXTROSE 2-4 GM/100ML-% IV SOLN
INTRAVENOUS | Status: AC
  Filled 2018-09-09: qty 100

## 2018-09-09 MED ORDER — LACTATED RINGERS IV SOLN
INTRAVENOUS | Status: DC
  Administered 2018-09-09: 13:00:00 via INTRAVENOUS

## 2018-09-09 MED ORDER — LIDOCAINE HCL (PF) 2 % IJ SOLN
INTRAMUSCULAR | Status: AC
  Filled 2018-09-09: qty 10

## 2018-09-09 SURGICAL SUPPLY — 33 items
ADH SKN CLS APL DERMABOND .7 (GAUZE/BANDAGES/DRESSINGS) ×4
CANISTER SUCT 1200ML W/VALVE (MISCELLANEOUS) ×2 IMPLANT
DERMABOND ADVANCED (GAUZE/BANDAGES/DRESSINGS) ×4
DERMABOND ADVANCED .7 DNX12 (GAUZE/BANDAGES/DRESSINGS) ×4 IMPLANT
DRAPE LAPAROTOMY 100X77 ABD (DRAPES) ×1 IMPLANT
ELECT CAUTERY NEEDLE TIP 1.0 (MISCELLANEOUS) ×2
ELECT REM PT RETURN 9FT ADLT (ELECTROSURGICAL) ×2
ELECTRODE CAUTERY NEDL TIP 1.0 (MISCELLANEOUS) ×1 IMPLANT
ELECTRODE REM PT RTRN 9FT ADLT (ELECTROSURGICAL) ×1 IMPLANT
GAUZE SPONGE 4X4 12PLY STRL (GAUZE/BANDAGES/DRESSINGS) IMPLANT
GLOVE INDICATOR 7.5 STRL GRN (GLOVE) ×1 IMPLANT
GLOVE SURG LX 7.5 STRW (GLOVE)
GLOVE SURG LX STRL 7.5 STRW (GLOVE) IMPLANT
GLOVE SURG SYN 6.5 ES PF (GLOVE) ×8 IMPLANT
GLOVE SURG SYN 6.5 PF PI (GLOVE) IMPLANT
GOWN STRL REUS W/ TWL LRG LVL3 (GOWN DISPOSABLE) ×2 IMPLANT
GOWN STRL REUS W/TWL LRG LVL3 (GOWN DISPOSABLE) ×4
KIT TURNOVER KIT A (KITS) ×2 IMPLANT
LABEL OR SOLS (LABEL) ×1 IMPLANT
NS IRRIG 1000ML POUR BTL (IV SOLUTION) ×2 IMPLANT
PACK BASIN MAJOR ARMC (MISCELLANEOUS) ×1 IMPLANT
PAD ABD DERMACEA PRESS 5X9 (GAUZE/BANDAGES/DRESSINGS) ×1 IMPLANT
STAPLER SKIN PROX 35W (STAPLE) IMPLANT
SUT CHROMIC 0 CT 1 (SUTURE) ×1 IMPLANT
SUT CHROMIC BR 1/2CLE 2-0 54IN (SUTURE) ×1 IMPLANT
SUT MNCRL AB 3-0 PS2 27 (SUTURE) ×3 IMPLANT
SUT PDS PLUS 0 (SUTURE)
SUT PDS PLUS AB 0 CT-2 (SUTURE) ×4 IMPLANT
SUT SILK 3 0 (SUTURE) ×2
SUT SILK 3-0 18XBRD TIE 12 (SUTURE) IMPLANT
SUT VIC AB 3-0 SH 27 (SUTURE) ×6
SUT VIC AB 3-0 SH 27X BRD (SUTURE) IMPLANT
TRAY FOLEY MTR SLVR 16FR STAT (SET/KITS/TRAYS/PACK) IMPLANT

## 2018-09-09 NOTE — Op Note (Addendum)
[facility]   OPERATIVE NOTE  DATE: 09/09/2018  PRE-OPERATIVE DIAGNOSIS: Multiple symptomatic glomus tumors  POST-OPERATIVE DIAGNOSIS: same as above  PROCEDURE: 1.   Excision of 6 symptomatic glomus tumors.  2 from the left lower extremity.  One from the right posterior thigh.  One from the right shoulder.  2 from the right forearm and wrist.  SURGEON: Leotis Pain  ASSISTANT(S): Hezzie Bump, PA-C  ANESTHESIA: general  ESTIMATED BLOOD LOSS: 50 cc  FINDING(S): 1.  Multiple glomus tumors   INDICATIONS:   Diane Reynolds is a 42 y.o. female who presents with multiple painful glomus tumors.  She has them all over the body, but she has a handful that her significantly larger and more symptomatic.  She desires to have these excised.  Risks and benefits were discussed and informed consent was obtained. An assistant was present during the procedure to help facilitate the exposure and expedite the procedure as well as perform wound closures.  DESCRIPTION: After obtaining full informed written consent, the patient was brought back to the operating room and placed prone upon the operating table.  The patient was prepped and draped in the standard fashion. The assistant provided retraction and mobilization to help facilitate exposure and expedite the procedure throughout the entire procedure.  This included following suture, using retractors, and optimizing lighting.  She also assisted with wound closure.  We started by prepping the left lower extremity and the right posterior thigh and buttock area.  The larger glomus tumor just above the medial knee on the left was then addressed first.  An elliptical incision was created around the tumor and the tumor was dissected down into.  This was a very vascular tumor and 2 vein branches were ligated and divided feeding the tumor.  This was then excised and removed.  The size of this tumor was about 3 cm x 2 to 3 cm.  The wound was  then closed with 3-0 Vicryl and a 4-0 Monocryl. The medial calf area was then dissected out.  Again, a small less than 1 cm by 1 cm glomus tumor was identified this location and dissected out and excised with a an elliptical incision.  A single silk tie was used to tie off and inflow and outflow vein to the tumor.  This wound was then closed with 3-0 Vicryl and 4-0 Monocryl as well. Attention was then turned to the posterior right thigh.  A transverse incision was created and we dissected down to the glomus tumor.  This was then dissected out with electrocautery and blunt dissection and removed and sent to pathology.  This tumor was about 2 cm x 2 cm.  The wound was closed with 3-0 Vicryl and 4-0 Monocryl. Attention was now turned to the right arm.  The right arm was sterilely prepped and draped in a sterile surgical field was created.  The sizable tumor at the shoulder area was then identified and an elliptical incision was created around this tumor.  This tumor measured somewhere in the range of 2 cm x 3 cm.  It was dissected out and a silk tie was used to tie off and inflow vein.  Electrocautery was used and the tumor was removed and sent to pathology.  The wound was then closed with 3-0 Vicryl and 4-0 Monocryl. Next, the distal forearm had 2 glomus tumors in close proximity.  Elliptical incision was created to dissected around both of the small glomus tumors.  Dissection was performed  with electrocautery and these 2 areas were removed and hemostasis was achieved with electrocautery.  The aggregate size of these 2 small glomus tumors was still less than 2 cm x 2 cm.  A few centimeters more distal at the wrist, another glomus tumor was seen.  A transverse elliptical incision was created at the level of the wrist and the glomus tumors dissected out.  This was only about 1 centimeter by 1 centimeter.  A 3-0 silk tie was used to tie off the inflow vessel.  The mass was then removed and sent to pathology.  The  right forearm and wrist wounds were then closed with 3-0 Vicryl and 4-0 Monocryl. Dermabond was placed as dressing on all 6 incisions. At this point, we elected to complete the procedure.  The patient was taken to the recovery room in stable condition.   COMPLICATIONS: None  CONDITION: Stable  Leotis Pain  09/09/2018, 3:13 PM    This note was created with Dragon Medical transcription system. Any errors in dictation are purely unintentional.

## 2018-09-09 NOTE — Transfer of Care (Signed)
Immediate Anesthesia Transfer of Care Note  Patient: Diane Reynolds  Procedure(s) Performed: EXCISION LIPOMA ( MULTIPLE GLOMUS TUMORS) (N/A )  Patient Location: PACU  Anesthesia Type:General  Level of Consciousness: awake and oriented  Airway & Oxygen Therapy: Patient Spontanous Breathing and Patient connected to face mask oxygen  Post-op Assessment: Report given to RN and Post -op Vital signs reviewed and stable  Post vital signs: Reviewed and stable  Last Vitals:  Vitals Value Taken Time  BP 116/37 09/09/2018  3:54 PM  Temp 36.2 C 09/09/2018  3:52 PM  Pulse 78 09/09/2018  3:55 PM  Resp 15 09/09/2018  3:55 PM  SpO2 100 % 09/09/2018  3:55 PM  Vitals shown include unvalidated device data.  Last Pain:  Vitals:   09/09/18 1552  TempSrc:   PainSc: Asleep         Complications: No apparent anesthesia complications

## 2018-09-09 NOTE — Discharge Instructions (Signed)
Lipoma Removal, Care After Refer to this sheet in the next few weeks. These instructions provide you with information about caring for yourself after your procedure. Your health care provider may also give you more specific instructions. Your treatment has been planned according to current medical practices, but problems sometimes occur. Call your health care provider if you have any problems or questions after your procedure. What can I expect after the procedure? After the procedure, it is common to have:  Mild pain.  Swelling.  Bruising.  Follow these instructions at home:  Bathing  Do not take baths, swim, or use a hot tub until your health care provider approves. Ask your health care provider if you can take showers. You may only be allowed to take sponge baths for bathing.  Keep your bandage (dressing) dry until your health care provider says it can be removed. Incision care   Follow instructions from your health care provider about how to take care of your incision. Make sure you: ? Wash your hands with soap and water before you change your bandage (dressing). If soap and water are not available, use hand sanitizer. ? Change your dressing as told by your health care provider. ? Leave stitches (sutures), skin glue, or adhesive strips in place. These skin closures may need to stay in place for 2 weeks or longer. If adhesive strip edges start to loosen and curl up, you may trim the loose edges. Do not remove adhesive strips completely unless your health care provider tells you to do that.  Check your incision area every day for signs of infection. Check for: ? More redness, swelling, or pain. ? Fluid or blood. ? Warmth. ? Pus or a bad smell. Driving  Do not drive or operate heavy machinery while taking prescription pain medicine.  Do not drive for 24 hours if you received a medicine to help you relax (sedative) during your procedure.  Ask your health care provider when it is  safe for you to drive. General instructions  Take over-the-counter and prescription medicines only as told by your health care provider.  Do not use any tobacco products, such as cigarettes, chewing tobacco, and e-cigarettes. These can delay healing. If you need help quitting, ask your health care provider.  Return to your normal activities as told by your health care provider. Ask your health care provider what activities are safe for you.  Keep all follow-up visits as told by your health care provider. This is important. Contact a health care provider if:  You have more redness, swelling, or pain around your incision.  You have fluid or blood coming from your incision.  Your incision feels warm to the touch.  You have pus or a bad smell coming from your incision.  You have pain that does not get better with medicine. Get help right away if:  You have chills or a fever.  You have severe pain. This information is not intended to replace advice given to you by your health care provider. Make sure you discuss any questions you have with your health care provider. Document Released: 02/28/2016 Document Revised: 05/27/2016 Document Reviewed: 02/28/2016 Elsevier Interactive Patient Education  2018 [place]   1) The drugs that you were given will stay in your system until tomorrow so for the next 24 hours you should not:  A) Drive an automobile B) Make any legal decisions C) Drink any alcoholic beverage   2) You may  resume regular meals tomorrow.  Today it is better to start with liquids and gradually work up to solid foods.  You may eat anything you prefer, but it is better to start with liquids, then soup and crackers, and gradually work up to solid foods.   3) Please notify your doctor immediately if you have any unusual bleeding, trouble breathing, redness and pain at the surgery site, drainage, fever, or pain not  relieved by medication.    4) Additional Instructions:        Please contact your physician with any problems or Same Day Surgery at [phone], Monday through Friday 6 am to 4 pm, or [place] at [facility] number at [phone].

## 2018-09-09 NOTE — Anesthesia Procedure Notes (Signed)
Procedure Name: Intubation Date/Time: 09/09/2018 1:33 PM Performed by: Jonna Clark, CRNA Pre-anesthesia Checklist: Patient identified, Patient being monitored, Timeout performed, Emergency Drugs available and Suction available Patient Re-evaluated:Patient Re-evaluated prior to induction Oxygen Delivery Method: Circle system utilized Preoxygenation: Pre-oxygenation with 100% oxygen Induction Type: IV induction Ventilation: Mask ventilation without difficulty and Oral airway inserted - appropriate to patient size Laryngoscope Size: Mac and 3 Grade View: Grade II Tube type: Oral Tube size: 7.0 mm Number of attempts: 1 Airway Equipment and Method: Stylet Placement Confirmation: ETT inserted through vocal cords under direct vision,  positive ETCO2 and breath sounds checked- equal and bilateral Secured at: 21 cm Tube secured with: Tape Dental Injury: Teeth and Oropharynx as per pre-operative assessment

## 2018-09-09 NOTE — Anesthesia Postprocedure Evaluation (Signed)
Anesthesia Post Note  Patient: Diane Reynolds  Procedure(s) Performed: EXCISION LIPOMA ( MULTIPLE GLOMUS TUMORS) (N/A )  Patient location during evaluation: PACU Anesthesia Type: General Level of consciousness: awake and alert Pain management: pain level controlled Vital Signs Assessment: post-procedure vital signs reviewed and stable Respiratory status: spontaneous breathing and respiratory function stable Cardiovascular status: stable Anesthetic complications: no     Last Vitals:  Vitals:   09/09/18 1140 09/09/18 1552  BP: 108/64 (!) 116/37  Pulse: 75 88  Resp: 12 13  Temp: 36.9 C (!) 36.2 C  SpO2: 100% 100%    Last Pain:  Vitals:   09/09/18 1552  TempSrc:   PainSc: Asleep                 Jonetta Dagley K

## 2018-09-09 NOTE — Anesthesia Post-op Follow-up Note (Signed)
Anesthesia QCDR form completed.        

## 2018-09-09 NOTE — H&P (Signed)
[facility] Admission History & Physical  MRN : [id number]  Diane Reynolds is a 42 y.o. (11-22-1976) female who presents with chief complaint of No chief complaint on file. 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  No new symptoms since she was seen in the office about 2 months ago.  She has these on both legs and both arms but the ones on the right arm are more prominent.  She has many others that are too small to characterize or removed.  No fevers or chills.  She is still having some lower extremity swelling and pain and a venous work-up as an outpatient is pending.  Current Facility-Administered Medications  Medication Dose Route Frequency Provider Last Rate Last Dose  . ceFAZolin (ANCEF) 2-4 GM/100ML-% IVPB           . ceFAZolin (ANCEF) IVPB 2g/100 mL premix  2 g Intravenous On Call to OR Kris Hartmann, NP      . famotidine (PEPCID) 20 MG tablet           . lactated ringers infusion   Intravenous Continuous Molli Barrows, MD 75 mL/hr at 09/09/18 1233      Past Medical History:  Diagnosis Date  . Asthma    exercise induced  . BRCA negative   . Migraine 2014  . Peripheral vascular disease [facility])     Past Surgical History:  Procedure Laterality Date  . MOLE REMOVAL  08/2009,09/2009   Biopsy compound Nevus w/slight-moderate melanocyte Atypia pubic region, excision for negative margins  . TONSILLECTOMY      Social History Social History   Tobacco Use  . Smoking status: Never Smoker  . Smokeless tobacco: Never Used  Substance Use Topics  . Alcohol use: Not Currently  . Drug use: No    Family History Family History  Problem Relation Age of Onset  . Breast cancer Mother 27  . Melanoma Mother 16       Malignant skin  . Breast cancer Maternal Aunt 34  . Ovarian cancer Other 51    Allergies  Allergen Reactions  . Azithromycin Anaphylaxis    REVIEW OF SYSTEMS (Negative unless  checked)  Constitutional: _0 Weight loss  _1 Fever  _2 Chills Cardiac: _3 Chest pain   _4 Chest pressure   _5 Palpitations   _6 Shortness of breath when laying flat   _7 Shortness of breath at rest   _8 Shortness of breath with exertion. Vascular:  _9 Pain in legs with walking   _10 Pain in legs at rest   _11 Pain in legs when laying flat   _12 Claudication   _13 Pain in feet when walking  _14 Pain in feet at rest  _15 Pain in feet when laying flat   _16 History of DVT   _17 Phlebitis   _18 Swelling in legs   _19 Varicose veins   _20 Non-healing ulcers Pulmonary:   _21 Uses home oxygen   _22 Productive cough   _23 Hemoptysis   _24 Wheeze  _25 COPD   _26 Asthma Neurologic:  _27 Dizziness  _28 Blackouts   _29 Seizures   _30 History of stroke   _31 History of TIA  _32 Aphasia   _33 Temporary blindness   _34 Dysphagia   _35 Weakness or numbness in arms   _36 Weakness or numbness in legs Musculoskeletal:  _37 Arthritis   _38 Joint swelling   _39 Joint pain   _40 Low back pain Hematologic:  _41 Easy bruising  _42 Easy bleeding   _43 Hypercoagulable state   _44 Anemic  _45 Hepatitis Gastrointestinal:  _46 Blood in stool   _47 Vomiting blood  _48 Gastroesophageal reflux/heartburn   _49 Abdominal pain Genitourinary:  _50   Chronic kidney disease   _0 Difficult urination  _1 Frequent urination  _2 Burning with urination   _3 Hematuria Skin:  _4 Rashes   _5 Ulcers   _6 Wounds Psychological:  _7 History of anxiety   _8  History of major depression.    Physical Examination  Vitals:   09/09/18 1140  BP: 108/64  Pulse: 75  Resp: 12  Temp: 98.5 F (36.9 C)  TempSrc: Oral  SpO2: 100%  Weight: 105.2 kg  Height: 5' 8.5" (1.74 m)   Body mass index is 34.75 kg/m. Gen: WD/WN, NAD Head: /AT, No temporalis wasting. Prominent temp pulse not noted. Ear/Nose/Throat: Hearing grossly intact, nares w/o erythema or drainage, oropharynx w/o Erythema/Exudate,  Eyes: Conjunctiva clear, sclera non-icteric Neck: Trachea midline.  No JVD.  Pulmonary:  Good air movement, respirations not labored, no use of  accessory muscles.  Cardiac: RRR, no JVD Vascular:  Vessel Right Left  Radial Palpable Palpable                          PT Palpable Palpable  DP Palpable Palpable   Gastrointestinal: soft, non-tender/non-distended. No guarding/reflex.  Musculoskeletal: M/S 5/5 throughout.  Extremities without ischemic changes.  No deformity or atrophy.  Mild LLE edema. Neurologic: Sensation grossly intact in extremities.  Symmetrical.  Speech is fluent. Motor exam as listed above. Psychiatric: Judgment intact, Mood & affect appropriate for pt's clinical situation. Dermatologic: Numerous small areas that look like bruises or venous malformations.  These areas are consistent with glomus tumors.  The most prominent ones are on the medial right wrist, the shoulder area on the right, the posterior right thigh, and the posterior left knee and calf.  These are the ones that she wants removed today.  She also has innumerable smaller ones.     CBC Lab Results  Component Value Date   WBC 8.5 09/02/2018   HGB 13.3 09/02/2018   HCT 38.9 09/02/2018   MCV 83.1 09/02/2018   PLT 223 09/02/2018    BMET    Component Value Date/Time   NA 141 09/02/2018 1225   K 3.2 (L) 09/02/2018 1225   CL 104 09/02/2018 1225   CO2 30 09/02/2018 1225   GLUCOSE 106 (H) 09/02/2018 1225   BUN 20 09/02/2018 1225   CREATININE 0.73 09/02/2018 1225   CALCIUM 9.2 09/02/2018 1225   GFRNONAA >60 09/02/2018 1225   GFRAA >60 09/02/2018 1225   Estimated Creatinine Clearance: 119.7 mL/min (by C-G formula based on SCr of 0.73 mg/dL).  COAG Lab Results  Component Value Date   INR 0.95 09/02/2018    Radiology No results found.   Assessment/Plan Pain in limb The patient describes symptoms that are consistent with venous insufficiency of the lower extremities.  We discussed the pathophysiology and natural history of venous disease.  We discussed compression, elevation, and anti-inflammatories as needed for pain.  We can check  a venous reflux study in several months to assess her venous anatomy and determine further treatment options.  We told her that this is separate from her glomus tumor issues.  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  We have discussed with her that the primary treatment for these as resection.  Injections can be considered, but tend to not be as reliably effective.  We have discussed that any ones that are causing significant symptoms can be resected.  These are generally benign and do not need to be resected if they are asymptomatic.  She desires to  have these removed.  I have asked her to mark the ones that she would like to have removed and she says she thinks she would like the right shoulder, the right wrist and forearm, the left posterior knee, and the right posterior thigh lesions removed.  This would be done with anesthesia at the hospital.  This would be an outpatient procedure.  Risks and benefits were discussed.   Leotis Pain, MD  09/09/2018 12:54 PM

## 2018-09-09 NOTE — Anesthesia Preprocedure Evaluation (Signed)
Anesthesia Evaluation  Patient identified by MRN, date of birth, ID band Patient awake    Reviewed: Allergy & Precautions, NPO status , Patient's Chart, lab work & pertinent test results  History of Anesthesia Complications Negative for: history of anesthetic complications  Airway Mallampati: II       Dental   Pulmonary asthma (exercise induced) , neg sleep apnea, neg COPD,           Cardiovascular (-) hypertension(-) Past MI and (-) CHF (-) dysrhythmias (-) Valvular Problems/Murmurs     Neuro/Psych neg Seizures    GI/Hepatic Neg liver ROS, neg GERD  ,  Endo/Other  neg diabetes  Renal/GU negative Renal ROS     Musculoskeletal   Abdominal   Peds  Hematology   Anesthesia Other Findings   Reproductive/Obstetrics                             Anesthesia Physical Anesthesia Plan  ASA: II  Anesthesia Plan: General   Post-op Pain Management:    Induction: Intravenous  PONV Risk Score and Plan: 3 and Dexamethasone, Ondansetron and Midazolam  Airway Management Planned: Oral ETT  Additional Equipment:   Intra-op Plan:   Post-operative Plan:   Informed Consent: I have reviewed the patients History and Physical, chart, labs and discussed the procedure including the risks, benefits and alternatives for the proposed anesthesia with the patient or authorized representative who has indicated his/her understanding and acceptance.     Plan Discussed with:   Anesthesia Plan Comments:         Anesthesia Quick Evaluation

## 2018-09-09 NOTE — Progress Notes (Signed)
I-stat K+ check resulted 3.7

## 2018-09-10 ENCOUNTER — Encounter: Payer: Self-pay | Admitting: Vascular Surgery

## 2018-09-13 LAB — SURGICAL PATHOLOGY

## 2018-09-29 ENCOUNTER — Ambulatory Visit (INDEPENDENT_AMBULATORY_CARE_PROVIDER_SITE_OTHER): Payer: Managed Care, Other (non HMO) | Admitting: Vascular Surgery

## 2018-09-29 ENCOUNTER — Encounter (INDEPENDENT_AMBULATORY_CARE_PROVIDER_SITE_OTHER): Payer: Self-pay | Admitting: Vascular Surgery

## 2018-09-29 VITALS — BP 95/62 | HR 63 | Resp 17 | Ht 68.0 in | Wt 229.0 lb

## 2018-09-29 DIAGNOSIS — M79604 Pain in right leg: Secondary | ICD-10-CM | POA: Diagnosis not present

## 2018-09-29 DIAGNOSIS — M79605 Pain in left leg: Secondary | ICD-10-CM | POA: Diagnosis not present

## 2018-09-29 DIAGNOSIS — D1800 Hemangioma unspecified site: Secondary | ICD-10-CM | POA: Diagnosis not present

## 2018-09-29 NOTE — Progress Notes (Signed)
Subjective:    Patient ID: Diane Reynolds, female    DOB: 01-Jan-1976, 42 y.o.   MRN: [id number] Chief Complaint  Patient presents with  . Follow-up    3week [facility] wound check   Patient presents for her first postoperative follow-up.  The patient is status post excision of 6 glomus tumors on September 09, 2018.  The patient notes each incision site has healed well.  Her postoperative course has been unremarkable.  The patient's initial visit was July 13, 2018 when she was encouraged to start engaging in conservative therapy including wearing medical grade 1 compression socks, elevating her legs and remaining active on a daily basis.  Conservative therapy has provided minimal improvement in her bilateral lower extremity pain and edema.  The patient still experiences these symptoms and feels that they have progressed to the point that she is unable to function on a daily basis.  The patient has not undergone any imaging of her lower extremity.  The patient denies any claudication-like symptoms, rest pain or ulcer formation to the bilateral lower extremity however does experience worsening edema especially towards the end of the day or with sitting and standing for long periods of time.  This edema is associated with discomfort.  The patient denies any fever, nausea vomiting.  Review of Systems  Constitutional: Negative.   HENT: Negative.   Eyes: Negative.   Respiratory: Negative.   Cardiovascular: Positive for leg swelling.  Gastrointestinal: Negative.   Endocrine: Negative.   Genitourinary: Negative.   Musculoskeletal: Negative.   Skin: Negative.   Allergic/Immunologic: Negative.   Neurological: Negative.   Hematological: Negative.   Psychiatric/Behavioral: Negative.       Objective:   Physical Exam  Constitutional: She is oriented to person, place, and time. She appears well-developed and well-nourished. No distress.  HENT:  Head: Normocephalic and atraumatic.  Right Ear: External  ear normal.  Left Ear: External ear normal.  Eyes: Pupils are equal, round, and reactive to light. Conjunctivae and EOM are normal.  Neck: Normal range of motion.  Cardiovascular: Normal rate, regular rhythm, normal heart sounds and intact distal pulses.  Pulses:      Radial pulses are 2+ on the right side, and 2+ on the left side.  Hard to palpate pedal pulses due to body habitus and edema however the bilateral feet are warm.  There is a good capillary refill.  Pulmonary/Chest: Effort normal and breath sounds normal.  Musculoskeletal: Normal range of motion. She exhibits edema (Mild to moderate nonpitting edema noted bilaterally).  Neurological: She is alert and oriented to person, place, and time.  Skin: She is not diaphoretic.  6 incision sites are healing well.  Clean dry and intact.  No signs of infection.  No ecchymosis.  No drainage. Less than 1 cm scattered varicosities noted to the bilateral lower extremity.  There is no stasis dermatitis, fibrosis, cellulitis or active ulcerations noted at this time.  Psychiatric: She has a normal mood and affect. Her behavior is normal. Judgment and thought content normal.  Vitals reviewed.  BP 95/62 (BP Location: Right Arm)   Pulse 63   Resp 17   Ht 5' 8"  (1.727 m)   Wt 229 lb (103.9 kg)   BMI 34.82 kg/m   Past Medical History:  Diagnosis Date  . Asthma    exercise induced  . BRCA negative   . Migraine 2014  . Peripheral vascular disease ([place])    Social History   Socioeconomic History  .  Marital status: Married    Spouse name: Not on file  . Number of children: Not on file  . Years of education: Not on file  . Highest education level: Not on file  Occupational History  . Not on file  Social Needs  . Financial resource strain: Not on file  . Food insecurity:    Worry: Not on file    Inability: Not on file  . Transportation needs:    Medical: Not on file    Non-medical: Not on file  Tobacco Use  . Smoking status: Never  Smoker  . Smokeless tobacco: Never Used  Substance and Sexual Activity  . Alcohol use: Not Currently  . Drug use: No  . Sexual activity: Yes    Birth control/protection: IUD  Lifestyle  . Physical activity:    Days per week: Not on file    Minutes per session: Not on file  . Stress: Not on file  Relationships  . Social connections:    Talks on phone: Not on file    Gets together: Not on file    Attends religious service: Not on file    Active member of club or organization: Not on file    Attends meetings of clubs or organizations: Not on file    Relationship status: Not on file  . Intimate partner violence:    Fear of current or ex partner: Not on file    Emotionally abused: Not on file    Physically abused: Not on file    Forced sexual activity: Not on file  Other Topics Concern  . Not on file  Social History Narrative  . Not on file   Past Surgical History:  Procedure Laterality Date  . LIPOMA EXCISION N/A 09/09/2018   Procedure: Excision of 6 symptomatic glomus tumors;  Surgeon: Algernon Huxley, MD;  Location: [facility] ORS;  Service: General;  Laterality: N/A;  . MOLE REMOVAL  08/2009,09/2009   Biopsy compound Nevus w/slight-moderate melanocyte Atypia pubic region, excision for negative margins  . TONSILLECTOMY     Family History  Problem Relation Age of Onset  . Breast cancer Mother 16  . Melanoma Mother 14       Malignant skin  . Breast cancer Maternal Aunt 23  . Ovarian cancer Other 51   Allergies  Allergen Reactions  . Azithromycin Anaphylaxis      Assessment & Plan:  Patient presents for her first postoperative follow-up.  The patient is status post excision of 6 glomus tumors on September 09, 2018.  The patient notes each incision site has healed well.  Her postoperative course has been unremarkable.  The patient's initial visit was July 13, 2018 when she was encouraged to start engaging in conservative therapy including wearing medical grade 1 compression socks,  elevating her legs and remaining active on a daily basis.  Conservative therapy has provided minimal improvement in her bilateral lower extremity pain and edema.  The patient still experiences these symptoms and feels that they have progressed to the point that she is unable to function on a daily basis.  The patient has not undergone any imaging of her lower extremity.  The patient denies any claudication-like symptoms, rest pain or ulcer formation to the bilateral lower extremity however does experience worsening edema especially towards the end of the day or with sitting and standing for long periods of time.  This edema is associated with discomfort.  The patient denies any fever, nausea vomiting.  1. Glomus tumor -  Stable Status post excision of 6 tumors The patient's postoperative course has been unremarkable The areas where the tumors were excised have healed nicely and the patient notes there is no longer any pain at the sites Incisions have all healed nicely  2. Pain in both lower extremities - Stable The patient has been engaging in conservative therapy including wearing medical grade 1 compression socks, elevating her legs and remaining active since her initial visit on July 13, 2018. Patient has not undergone any imaging of her lower extremities to rule out contributing venous versus lymphatic disease I will bring the patient back and have her undergo bilateral lower extremity venous duplex The patient is to continue engaging conservative therapy until her follow-up visit The patient was instructed to call the office in the interim if any worsening edema or ulcerations to the legs, feet or toes occurs. The patient expresses their understanding.  - VAS [place] LOWER EXTREMITY VENOUS REFLUX; Future  Current Outpatient Medications on File Prior to Visit  Medication Sig Dispense Refill  . Acetylcysteine 600 MG CAPS Take 1 capsule by mouth daily.    . Ascorbic Acid (VITAMIN C) 1000 MG tablet  Take 1,000 mg by mouth daily.    Marland Kitchen BETAINE PO Take 1 tablet by mouth daily.    . Cholecalciferol (VITAMIN D3 PO) Take 7,500 Int'l Units by mouth daily.    Marland Kitchen levonorgestrel (MIRENA) 20 MCG/24HR IUD 1 each by Intrauterine route once.    Marland Kitchen MAGNESIUM MALATE PO Take 2 capsules by mouth daily.    . Menaquinone-7 (VITAMIN K2 PO) Take 150 mcg by mouth daily.    . Misc Natural Products (ADRENAL PO) Take 2 capsules by mouth 2 (two) times daily.    Marland Kitchen OVER THE COUNTER MEDICATION Take 1 capsule by mouth daily.    . Pregnenolone Micronized POWD 75 mg by Does not apply route daily.    . Probiotic Product (PROBIOTIC DAILY PO) Take 1 capsule by mouth daily.    . progesterone (PROMETRIUM) 200 MG capsule Take 200 mg by mouth at bedtime. 1 at bedtime for 3 weeks and one week off    . TESTOSTERONE COMPOUNDING KIT 20 % CREA Place onto the skin daily.    Marland Kitchen thyroid (ARMOUR) 65 MG tablet Take 32.5 mg by mouth daily.    . vitamin A 25000 UNIT capsule Take 25,000 Units by mouth daily.    . Zinc 50 MG CAPS Take 1 capsule by mouth daily.    Marland Kitchen HYDROcodone-acetaminophen (NORCO) 5-325 MG tablet Take 1 tablet by mouth every 6 (six) hours as needed for moderate pain. (Patient not taking: Reported on 09/29/2018) 30 tablet 0   No current facility-administered medications on file prior to visit.    There are no Patient Instructions on file for this visit. No follow-ups on file.  Meghanne Pletz A Holger Sokolowski, PA-C

## 2018-09-30 ENCOUNTER — Ambulatory Visit (INDEPENDENT_AMBULATORY_CARE_PROVIDER_SITE_OTHER): Payer: Managed Care, Other (non HMO) | Admitting: Vascular Surgery

## 2018-10-15 ENCOUNTER — Encounter (INDEPENDENT_AMBULATORY_CARE_PROVIDER_SITE_OTHER): Payer: Managed Care, Other (non HMO)

## 2018-10-15 ENCOUNTER — Ambulatory Visit (INDEPENDENT_AMBULATORY_CARE_PROVIDER_SITE_OTHER): Payer: Managed Care, Other (non HMO) | Admitting: Nurse Practitioner

## 2018-10-21 ENCOUNTER — Ambulatory Visit (INDEPENDENT_AMBULATORY_CARE_PROVIDER_SITE_OTHER): Payer: Managed Care, Other (non HMO) | Admitting: Vascular Surgery

## 2018-10-21 ENCOUNTER — Encounter (INDEPENDENT_AMBULATORY_CARE_PROVIDER_SITE_OTHER): Payer: Managed Care, Other (non HMO)

## 2018-11-08 ENCOUNTER — Ambulatory Visit (INDEPENDENT_AMBULATORY_CARE_PROVIDER_SITE_OTHER): Payer: Managed Care, Other (non HMO)

## 2018-11-08 ENCOUNTER — Ambulatory Visit (INDEPENDENT_AMBULATORY_CARE_PROVIDER_SITE_OTHER): Payer: Managed Care, Other (non HMO) | Admitting: Vascular Surgery

## 2018-11-08 ENCOUNTER — Encounter (INDEPENDENT_AMBULATORY_CARE_PROVIDER_SITE_OTHER): Payer: Self-pay | Admitting: Vascular Surgery

## 2018-11-08 VITALS — BP 99/52 | HR 54 | Resp 17 | Ht 68.5 in | Wt 224.0 lb

## 2018-11-08 DIAGNOSIS — I890 Lymphedema, not elsewhere classified: Secondary | ICD-10-CM | POA: Diagnosis not present

## 2018-11-08 DIAGNOSIS — D1800 Hemangioma unspecified site: Secondary | ICD-10-CM | POA: Diagnosis not present

## 2018-11-08 DIAGNOSIS — M79604 Pain in right leg: Secondary | ICD-10-CM

## 2018-11-08 DIAGNOSIS — I872 Venous insufficiency (chronic) (peripheral): Secondary | ICD-10-CM | POA: Diagnosis not present

## 2018-11-08 DIAGNOSIS — M79605 Pain in left leg: Secondary | ICD-10-CM

## 2018-11-08 NOTE — Progress Notes (Signed)
Subjective:    Patient ID: Diane Reynolds, female    DOB: 1976-11-14, 42 y.o.   MRN: [id number] Chief Complaint  Patient presents with  . Follow-up    Bilateral Reflux u/s f/u   Patient presents to review vascular studies.  The patient was seen approximately 1 month ago for her first postoperative visit.  The patient had multiple Glomus tumors removed.  The patient notes a market improvement to the areas in which these tumors were removed.  The patient notes a market improvement in the pain to these areas.  All of her incisions have healed well.  Since her initial visit approximately 3 months ago the patient has been engaging in conservative therapy including wearing medical grade 1 compression socks, elevating her legs remain active.  This provides minimal improvement in her lower extremity discomfort and edema.  The patient feels that her symptoms have progressed to the point that she is unable to function on daily basis.  She notes that her symptoms have become lifestyle limiting.  The patient underwent a bilateral lower extremity venous reflux study which was notable for reflux in the right great common femoral vein.  There is no evidence of reflux noted in the left lower extremity.  No evidence of deep vein or superficial thrombophlebitis bilaterally.  The patient denies any fever, nausea vomiting.  Review of Systems  Constitutional: Negative.   HENT: Negative.   Eyes: Negative.   Respiratory: Negative.   Cardiovascular: Positive for leg swelling.       Lower extremity discomfort  Gastrointestinal: Negative.   Endocrine: Negative.   Genitourinary: Negative.   Musculoskeletal: Negative.   Skin: Negative.   Allergic/Immunologic: Negative.   Neurological: Negative.   Hematological: Negative.   Psychiatric/Behavioral: Negative.       Objective:   Physical Exam  Constitutional: She is oriented to person, place, and time. She appears well-developed and well-nourished. No distress.    HENT:  Head: Normocephalic and atraumatic.  Right Ear: External ear normal.  Left Ear: External ear normal.  Eyes: Pupils are equal, round, and reactive to light. Conjunctivae and EOM are normal.  Neck: Normal range of motion.  Cardiovascular: Normal rate, regular rhythm, normal heart sounds and intact distal pulses.  Pulses:      Radial pulses are 2+ on the right side, and 2+ on the left side.       Dorsalis pedis pulses are 2+ on the right side, and 2+ on the left side.       Posterior tibial pulses are 2+ on the right side, and 2+ on the left side.  Pulmonary/Chest: Effort normal and breath sounds normal.  Neurological: She is alert and oriented to person, place, and time.  Skin: Skin is warm and dry. She is not diaphoretic.  Scattered patches of greater than 1 cm varicosities noted to the bilateral legs.  There is no stasis dermatitis, fibrosis, cellulitis or active ulcerations noted at this time.  Psychiatric: She has a normal mood and affect. Her behavior is normal. Judgment and thought content normal.  Vitals reviewed.  BP (!) 99/52 (BP Location: Right Arm, Patient Position: Sitting)   Pulse (!) 54   Resp 17   Ht 5' 8.5" (1.74 m)   Wt 224 lb (101.6 kg)   BMI 33.56 kg/m   Past Medical History:  Diagnosis Date  . Asthma    exercise induced  . BRCA negative   . Migraine 2014  . Peripheral vascular disease ([place])  Social History   Socioeconomic History  . Marital status: Married    Spouse name: Not on file  . Number of children: Not on file  . Years of education: Not on file  . Highest education level: Not on file  Occupational History  . Not on file  Social Needs  . Financial resource strain: Not on file  . Food insecurity:    Worry: Not on file    Inability: Not on file  . Transportation needs:    Medical: Not on file    Non-medical: Not on file  Tobacco Use  . Smoking status: Never Smoker  . Smokeless tobacco: Never Used  Substance and Sexual Activity   . Alcohol use: Not Currently  . Drug use: No  . Sexual activity: Yes    Birth control/protection: IUD  Lifestyle  . Physical activity:    Days per week: Not on file    Minutes per session: Not on file  . Stress: Not on file  Relationships  . Social connections:    Talks on phone: Not on file    Gets together: Not on file    Attends religious service: Not on file    Active member of club or organization: Not on file    Attends meetings of clubs or organizations: Not on file    Relationship status: Not on file  . Intimate partner violence:    Fear of current or ex partner: Not on file    Emotionally abused: Not on file    Physically abused: Not on file    Forced sexual activity: Not on file  Other Topics Concern  . Not on file  Social History Narrative  . Not on file   Past Surgical History:  Procedure Laterality Date  . LIPOMA EXCISION N/A 09/09/2018   Procedure: Excision of 6 symptomatic glomus tumors;  Surgeon: Algernon Huxley, MD;  Location: [facility] ORS;  Service: General;  Laterality: N/A;  . MOLE REMOVAL  08/2009,09/2009   Biopsy compound Nevus w/slight-moderate melanocyte Atypia pubic region, excision for negative margins  . TONSILLECTOMY     Family History  Problem Relation Age of Onset  . Breast cancer Mother 26  . Melanoma Mother 62       Malignant skin  . Breast cancer Maternal Aunt 24  . Ovarian cancer Other 51   Allergies  Allergen Reactions  . Azithromycin Anaphylaxis      Assessment & Plan:  Patient presents to review vascular studies.  The patient was seen approximately 1 month ago for her first postoperative visit.  The patient had multiple Glomus tumors removed.  The patient notes a market improvement to the areas in which these tumors were removed.  The patient notes a market improvement in the pain to these areas.  All of her incisions have healed well.  Since her initial visit approximately 3 months ago the patient has been engaging in conservative  therapy including wearing medical grade 1 compression socks, elevating her legs remain active.  This provides minimal improvement in her lower extremity discomfort and edema.  The patient feels that her symptoms have progressed to the point that she is unable to function on daily basis.  She notes that her symptoms have become lifestyle limiting.  The patient underwent a bilateral lower extremity venous reflux study which was notable for reflux in the right great common femoral vein.  There is no evidence of reflux noted in the left lower extremity.  No evidence of deep  vein or superficial thrombophlebitis bilaterally.  The patient denies any fever, nausea vomiting.  1. Chronic venous insufficiency - New Due to the location of the patient's venous reflux located in her right common femoral vein she is not a candidate for endovenous laser ablation. The patient does have multiple clusters of greater than 1 cm varicosities located to the bilateral legs The discomfort she experiences along these varicosities have progressed to the point that she is unable to function on a daily basis. The patient would greatly benefit from saline sclerotherapy to these clusters to close them down thus eliminating her discomfort I will apply to the patient's insurance Saline sclerotherapy to the bilateral lower extremity for painful because veins  2. 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  Current Outpatient Medications on File Prior to Visit  Medication Sig Dispense Refill  . Acetylcysteine 600 MG CAPS Take 1 capsule by mouth daily.    . Ascorbic Acid (VITAMIN C) 1000 MG tablet Take 1,000 mg by mouth daily.    Marland Kitchen BETAINE PO Take 1 tablet by mouth daily.    . Cholecalciferol (VITAMIN D3 PO) Take 7,500 Int'l Units by mouth daily.    Marland Kitchen HYDROcodone-acetaminophen (NORCO) 5-325 MG tablet Take 1 tablet by mouth every 6 (six) hours as needed for moderate pain. (Patient not taking: Reported on 09/29/2018) 30 tablet 0  . levonorgestrel (MIRENA) 20 MCG/24HR IUD 1 each by Intrauterine route once.    Marland Kitchen MAGNESIUM MALATE PO Take 2 capsules by mouth daily.    . Menaquinone-7 (VITAMIN K2 PO) Take 150 mcg by mouth daily.    . Misc Natural Products (ADRENAL PO) Take 2 capsules by mouth 2 (two) times daily.    Marland Kitchen OVER THE COUNTER MEDICATION Take 1 capsule by mouth daily.    . Pregnenolone Micronized POWD 75 mg by Does not apply route daily.    . Probiotic Product (PROBIOTIC DAILY PO) Take 1 capsule by mouth daily.    . progesterone (PROMETRIUM) 200 MG capsule Take 200 mg by mouth at bedtime. 1 at bedtime for 3 weeks and one week off    . TESTOSTERONE COMPOUNDING KIT 20 % CREA Place onto the skin daily.    Marland Kitchen thyroid (ARMOUR) 65 MG tablet Take 32.5 mg by mouth daily.    . vitamin A 25000 UNIT capsule Take 25,000 Units by mouth daily.    . Zinc 50 MG CAPS Take 1 capsule by mouth daily.     No current facility-administered medications on file prior to visit.    There are no Patient Instructions on file for this visit. No follow-ups on file.  Shawana Knoch A Tramell Piechota, PA-C

## 2018-11-16 ENCOUNTER — Other Ambulatory Visit (HOSPITAL_COMMUNITY)
Admission: RE | Admit: 2018-11-16 | Discharge: 2018-11-16 | Disposition: A | Discharge status: Home / Self Care | Payer: Managed Care, Other (non HMO) | Source: Ambulatory Visit | Attending: Obstetrics and Gynecology | Admitting: Obstetrics and Gynecology

## 2018-11-16 ENCOUNTER — Encounter: Payer: Self-pay | Admitting: Obstetrics and Gynecology

## 2018-11-16 ENCOUNTER — Ambulatory Visit (INDEPENDENT_AMBULATORY_CARE_PROVIDER_SITE_OTHER): Payer: Managed Care, Other (non HMO) | Admitting: Obstetrics and Gynecology

## 2018-11-16 VITALS — BP 110/60 | HR 78 | Ht 68.0 in | Wt 225.0 lb

## 2018-11-16 DIAGNOSIS — Z124 Encounter for screening for malignant neoplasm of cervix: Secondary | ICD-10-CM | POA: Insufficient documentation

## 2018-11-16 DIAGNOSIS — Z1371 Encounter for nonprocreative screening for genetic disease carrier status: Secondary | ICD-10-CM

## 2018-11-16 DIAGNOSIS — Z1239 Encounter for other screening for malignant neoplasm of breast: Secondary | ICD-10-CM

## 2018-11-16 DIAGNOSIS — Z01419 Encounter for gynecological examination (general) (routine) without abnormal findings: Secondary | ICD-10-CM

## 2018-11-16 NOTE — Progress Notes (Signed)
Gynecology Annual Exam  PCP: Patient, No Pcp Per  Chief Complaint:  Chief Complaint  Patient presents with  . Gynecologic Exam    painful vaginal bumps    History of Present Illness: Patient is a 42 y.o. [id number] presents for annual exam. The patient has no complaints today.   LMP: No LMP recorded. (Menstrual status: IUD).  The patient is sexually active. She currently uses IUD for contraception. She denies dyspareunia.  The patient does perform self breast exams.  There is no notable family history of breast or ovarian cancer in her family.  The patient wears seatbelts: yes.   The patient has regular exercise: not asked.    The patient denies current symptoms of depression.    Review of Systems: Review of Systems  Constitutional: Negative for chills and fever.  HENT: Negative for congestion.   Respiratory: Negative for cough and shortness of breath.   Cardiovascular: Negative for chest pain and palpitations.  Gastrointestinal: Negative for abdominal pain, constipation, diarrhea, heartburn, nausea and vomiting.  Genitourinary: Negative for dysuria, frequency and urgency.  Skin: Negative for itching and rash.  Neurological: Negative for dizziness and headaches.  Endo/Heme/Allergies: Negative for polydipsia.  Psychiatric/Behavioral: Negative for depression.    Past Medical History:  Past Medical History:  Diagnosis Date  . Asthma    exercise induced  . BRCA negative   . Migraine 2014  . Peripheral vascular disease [facility])     Past Surgical History:  Past Surgical History:  Procedure Laterality Date  . LIPOMA EXCISION N/A 09/09/2018   Procedure: Excision of 6 symptomatic glomus tumors;  Surgeon: Algernon Huxley, MD;  Location: [facility] ORS;  Service: General;  Laterality: N/A;  . MOLE REMOVAL  08/2009,09/2009   Biopsy compound Nevus w/slight-moderate melanocyte Atypia pubic region, excision for negative margins  . TONSILLECTOMY      Gynecologic History:  No LMP recorded.  (Menstrual status: IUD). Contraception:07/16/16 Mirena Last Pap: Results were:07/30/15  NIL and HR HPV negative   Obstetric History: [id number]  Family History:  Family History  Problem Relation Age of Onset  . Breast cancer Mother 54  . Melanoma Mother 67       Malignant skin  . Breast cancer Maternal Aunt 83  . Ovarian cancer Other 65    Social History:  Social History   Socioeconomic History  . Marital status: Married    Spouse name: Not on file  . Number of children: Not on file  . Years of education: Not on file  . Highest education level: Not on file  Occupational History  . Not on file  Social Needs  . Financial resource strain: Not on file  . Food insecurity:    Worry: Not on file    Inability: Not on file  . Transportation needs:    Medical: Not on file    Non-medical: Not on file  Tobacco Use  . Smoking status: Never Smoker  . Smokeless tobacco: Never Used  Substance and Sexual Activity  . Alcohol use: Not Currently  . Drug use: No  . Sexual activity: Yes    Birth control/protection: IUD  Lifestyle  . Physical activity:    Days per week: Not on file    Minutes per session: Not on file  . Stress: Not on file  Relationships  . Social connections:    Talks on phone: Not on file    Gets together: Not on file    Attends religious service: Not on file  Active member of club or organization: Not on file    Attends meetings of clubs or organizations: Not on file    Relationship status: Not on file  . Intimate partner violence:    Fear of current or ex partner: Not on file    Emotionally abused: Not on file    Physically abused: Not on file    Forced sexual activity: Not on file  Other Topics Concern  . Not on file  Social History Narrative  . Not on file    Allergies:  Allergies  Allergen Reactions  . Azithromycin Anaphylaxis    Medications: Prior to Admission medications   Medication Sig Start Date End Date Taking? Authorizing Provider    Acetylcysteine 600 MG CAPS Take 1 capsule by mouth daily.   Yes [provider]  Ascorbic Acid (VITAMIN C) 1000 MG tablet Take 1,000 mg by mouth daily.   Yes [provider]  BETAINE PO Take 1 tablet by mouth daily.   Yes [provider]  Cholecalciferol (VITAMIN D3 PO) Take 7,500 Int'l Units by mouth daily.   Yes [provider]  levonorgestrel (MIRENA) 20 MCG/24HR IUD 1 each by Intrauterine route once.   Yes [provider]  MAGNESIUM MALATE PO Take 2 capsules by mouth daily.   Yes [provider]  Menaquinone-7 (VITAMIN K2 PO) Take 150 mcg by mouth daily.   Yes [provider]  Misc Natural Products (ADRENAL PO) Take 2 capsules by mouth 2 (two) times daily.   Yes [provider]  OVER THE COUNTER MEDICATION Take 1 capsule by mouth daily.   Yes [provider]  Pregnenolone Micronized POWD 75 mg by Does not apply route daily.   Yes [provider]  Probiotic Product (PROBIOTIC DAILY PO) Take 1 capsule by mouth daily.   Yes [provider]  progesterone (PROMETRIUM) 200 MG capsule Take 200 mg by mouth at bedtime. 1 at bedtime for 3 weeks and one week off   Yes [provider]  TESTOSTERONE COMPOUNDING KIT 20 % CREA Place onto the skin daily.   Yes [provider]  thyroid (ARMOUR) 65 MG tablet Take 32.5 mg by mouth daily.   Yes [provider]  vitamin A 25000 UNIT capsule Take 25,000 Units by mouth daily.   Yes [provider]  Zinc 50 MG CAPS Take 1 capsule by mouth daily.   Yes [provider]    Physical Exam Vitals: Blood pressure 110/60, pulse 78, height _0  (1.727 m), weight 225 lb (102.1 kg).  General: NAD HEENT: normocephalic, anicteric Thyroid: no enlargement, no palpable nodules Pulmonary: No increased work of breathing, CTAB Cardiovascular: RRR, distal pulses 2+ Breast: Breast symmetrical, no tenderness, no palpable nodules or  masses, no skin or nipple retraction present, no nipple discharge.  No axillary or supraclavicular lymphadenopathy. Abdomen: NABS, soft, non-tender, non-distended.  Umbilicus without lesions.  No hepatomegaly, splenomegaly or masses palpable. No evidence of hernia  Genitourinary:  External: Normal external female genitalia.  Normal urethral meatus, normal Bartholin's and Skene's glands.    Vagina: Normal vaginal mucosa, no evidence of prolapse.    Cervix: Grossly normal in appearance, no bleeding, IUD strings 3cm  Uterus: Non-enlarged, mobile, normal contour.  No CMT  Adnexa: ovaries non-enlarged, no adnexal masses  Rectal: deferred  Lymphatic: no evidence of inguinal lymphadenopathy Extremities: no edema, erythema, or tenderness Neurologic: Grossly intact Psychiatric: mood appropriate, affect full  Female chaperone present for pelvic and breast  portions  of the physical exam    Assessment: 42 y.o. [id number] routine annual exam  Plan: Problem List Items Addressed This Visit    None    Visit Diagnoses    Encounter for gynecological examination without abnormal finding    -  Primary   Screening for malignant neoplasm of cervix       Relevant Orders   Cytology - PAP   Breast screening       Relevant Orders   MM 3D SCREEN BREAST BILATERAL      1) Mammogram - recommend yearly screening mammogram.  Mammogram Was ordered today - BRCA negative  2) STI screening  was notoffered and therefore not obtained  3) ASCCP guidelines and rational discussed.  Patient opts for every 3 years screening interval  4) Contraception - the patient is currently using  IUD.  She is happy with her current form of contraception and plans to continue  5) Colonoscopy -- Screening recommended starting at age 89 for average risk individuals, age 36 for individuals deemed at increased risk (including African Americans) and recommended to continue until age 52.  For patient age 63-85 individualized approach is  recommended.  Gold standard screening is via colonoscopy, Cologuard screening is an acceptable alternative for patient unwilling or unable to undergo colonoscopy.  "Colorectal cancer screening for average?risk adults: 2018 guideline update from the American Cancer Society"CA: A Cancer Journal for Clinicians: May 27, 2017   6) Routine healthcare maintenance including cholesterol, diabetes screening discussed managed by PCP  7) Return in about 1 year (around 11/17/2019) for annual.   [name], MD, [facility], [place] 11/16/2018, 10:52 AM

## 2018-11-16 NOTE — Patient Instructions (Signed)
[facility] [address] [place] [address]  [facility] [place]  [address] [place] [address]  Phone: [phone]  

## 2018-11-17 DIAGNOSIS — Z1371 Encounter for nonprocreative screening for genetic disease carrier status: Secondary | ICD-10-CM | POA: Insufficient documentation

## 2018-11-18 ENCOUNTER — Ambulatory Visit
Admission: RE | Admit: 2018-11-18 | Discharge: 2018-11-18 | Disposition: A | Discharge status: Home / Self Care | Payer: Managed Care, Other (non HMO) | Source: Ambulatory Visit | Attending: Obstetrics and Gynecology | Admitting: Obstetrics and Gynecology

## 2018-11-18 DIAGNOSIS — Z1239 Encounter for other screening for malignant neoplasm of breast: Secondary | ICD-10-CM | POA: Diagnosis present

## 2018-11-19 LAB — CYTOLOGY - PAP
Diagnosis: NEGATIVE
HPV: NOT DETECTED

## 2018-12-06 ENCOUNTER — Ambulatory Visit (INDEPENDENT_AMBULATORY_CARE_PROVIDER_SITE_OTHER): Payer: Managed Care, Other (non HMO) | Admitting: Nurse Practitioner

## 2018-12-06 ENCOUNTER — Encounter (INDEPENDENT_AMBULATORY_CARE_PROVIDER_SITE_OTHER): Payer: Self-pay | Admitting: Nurse Practitioner

## 2018-12-06 VITALS — BP 105/59 | HR 76 | Resp 18 | Ht 68.5 in | Wt 227.0 lb

## 2018-12-06 DIAGNOSIS — I872 Venous insufficiency (chronic) (peripheral): Secondary | ICD-10-CM

## 2018-12-06 DIAGNOSIS — I8310 Varicose veins of unspecified lower extremity with inflammation: Secondary | ICD-10-CM | POA: Diagnosis not present

## 2018-12-06 NOTE — Progress Notes (Signed)
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   Procedure: Sclerotherapy using hypertonic saline mixed with 1% Lidocaine was performed on the bilateral lower extremities. Compression wraps were placed. The patient tolerated the procedure well.

## 2019-01-03 ENCOUNTER — Ambulatory Visit (INDEPENDENT_AMBULATORY_CARE_PROVIDER_SITE_OTHER): Payer: Managed Care, Other (non HMO) | Admitting: Nurse Practitioner

## 2019-01-24 ENCOUNTER — Ambulatory Visit (INDEPENDENT_AMBULATORY_CARE_PROVIDER_SITE_OTHER): Payer: Managed Care, Other (non HMO) | Admitting: Nurse Practitioner

## 2019-02-07 ENCOUNTER — Ambulatory Visit (INDEPENDENT_AMBULATORY_CARE_PROVIDER_SITE_OTHER): Payer: Managed Care, Other (non HMO) | Admitting: Nurse Practitioner

## 2019-02-16 ENCOUNTER — Encounter (INDEPENDENT_AMBULATORY_CARE_PROVIDER_SITE_OTHER): Payer: Self-pay | Admitting: Nurse Practitioner

## 2019-05-10 ENCOUNTER — Ambulatory Visit (INDEPENDENT_AMBULATORY_CARE_PROVIDER_SITE_OTHER): Payer: Managed Care, Other (non HMO) | Admitting: Nurse Practitioner

## 2019-08-02 IMAGING — MG DIGITAL SCREENING BILATERAL MAMMOGRAM WITH TOMO AND CAD
6 of 12 series · 6 of 36 positions shown · non-contrast
Comparison: Previous exam(s).

CLINICAL DATA: Screening.

EXAM:
DIGITAL SCREENING BILATERAL MAMMOGRAM WITH TOMO AND CAD

[L CC synth-2D (1 of 2)]
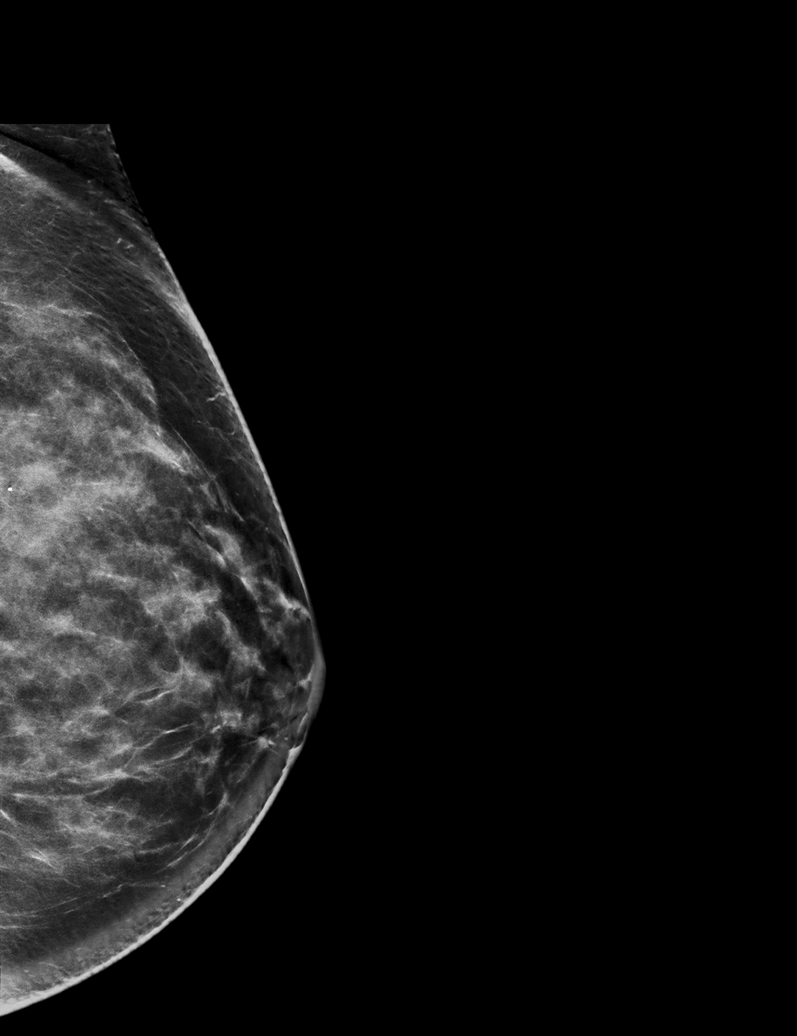

[L CC synth-2D (2 of 2)]
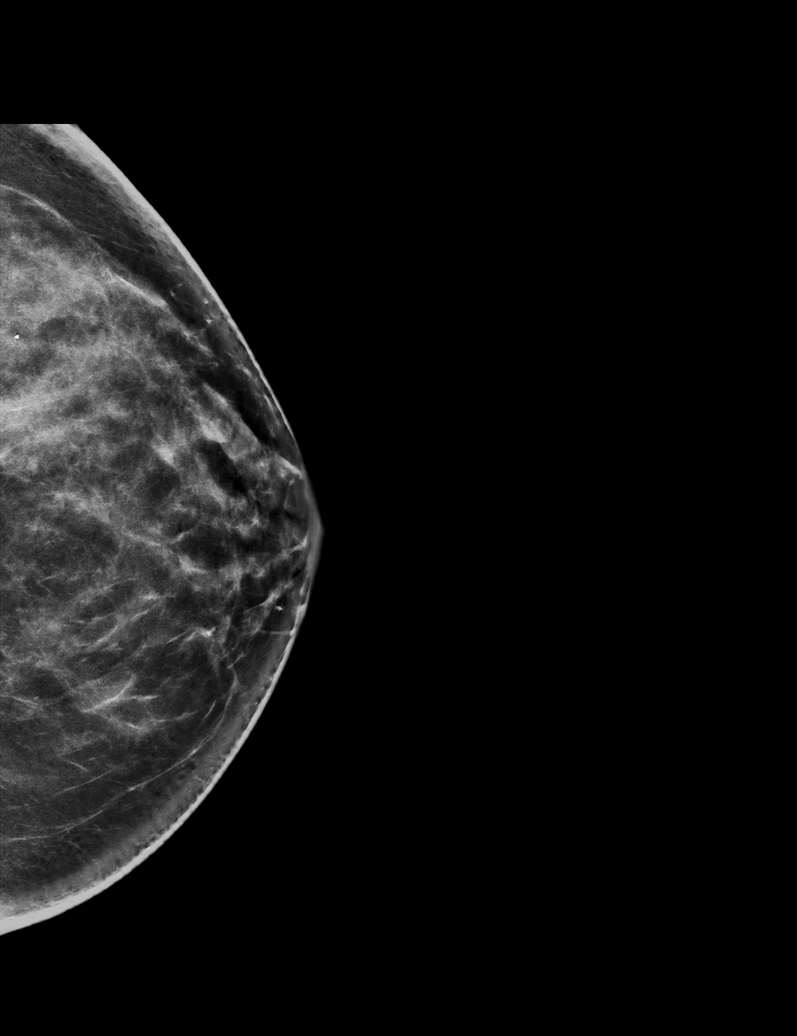

[R CC synth-2D (1 of 2)]
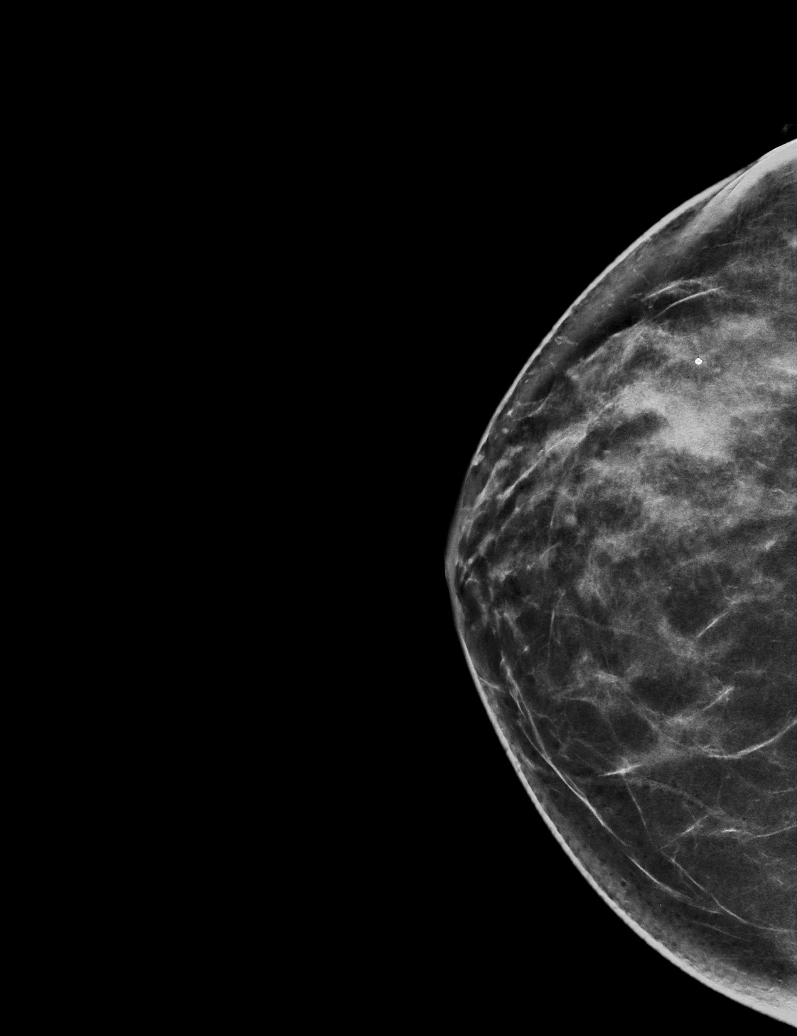

[L MLO synth-2D]
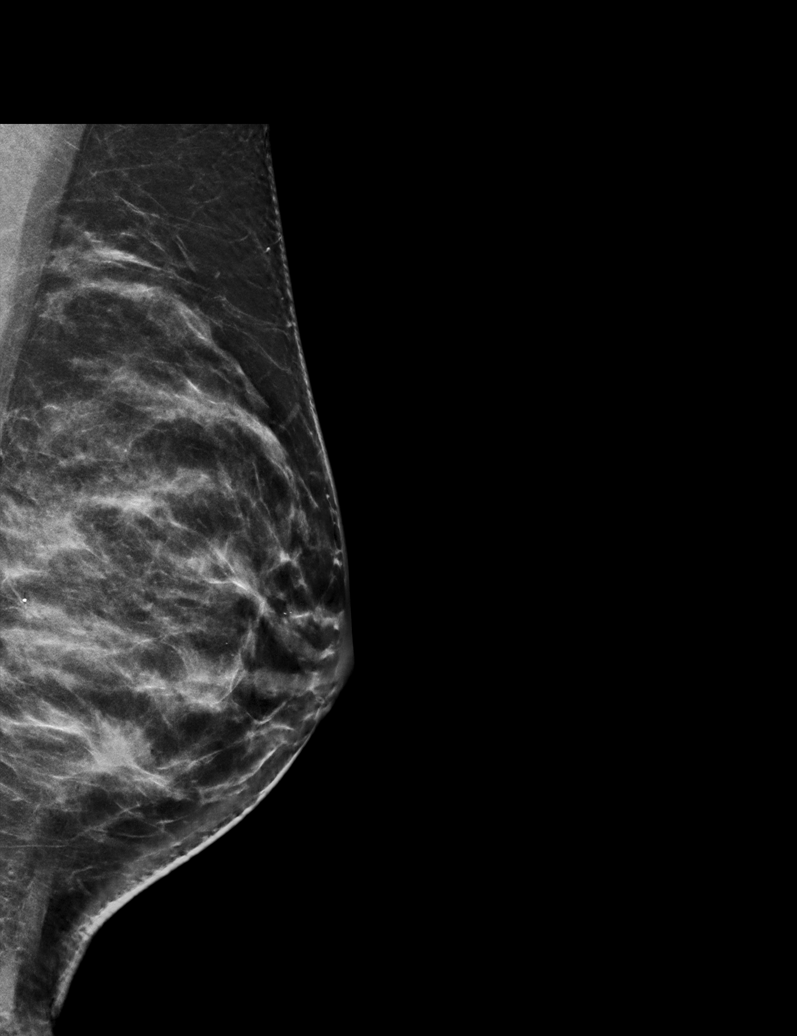

[R CC synth-2D (2 of 2)]
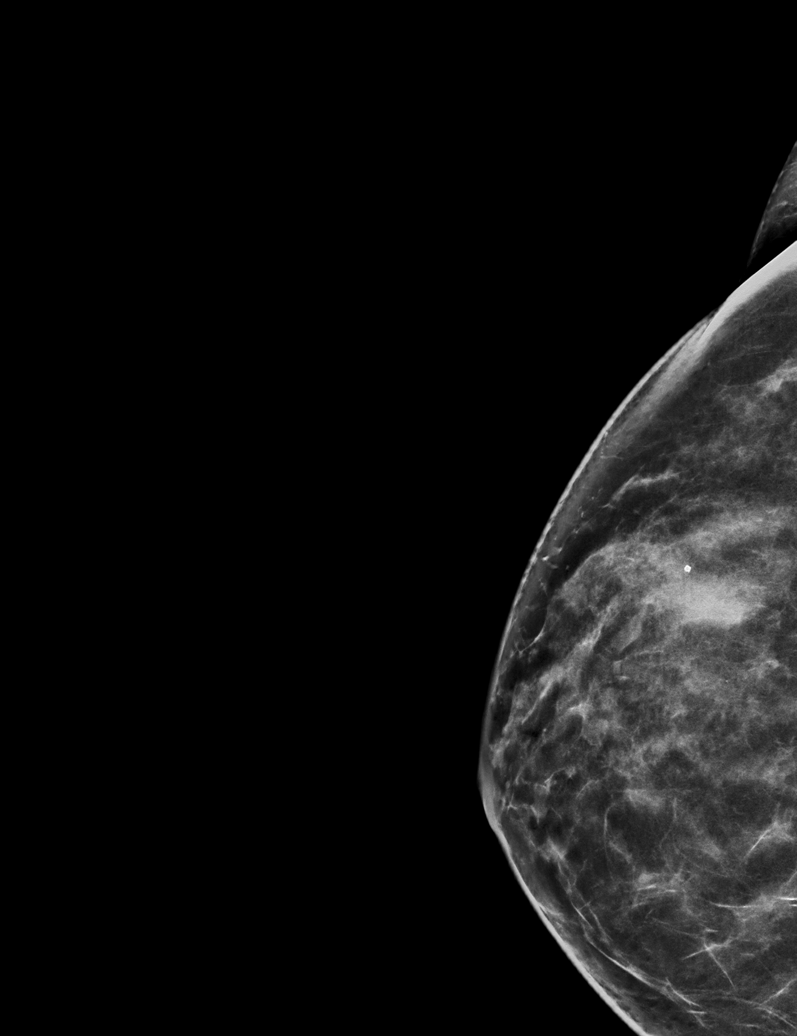

[R MLO synth-2D]
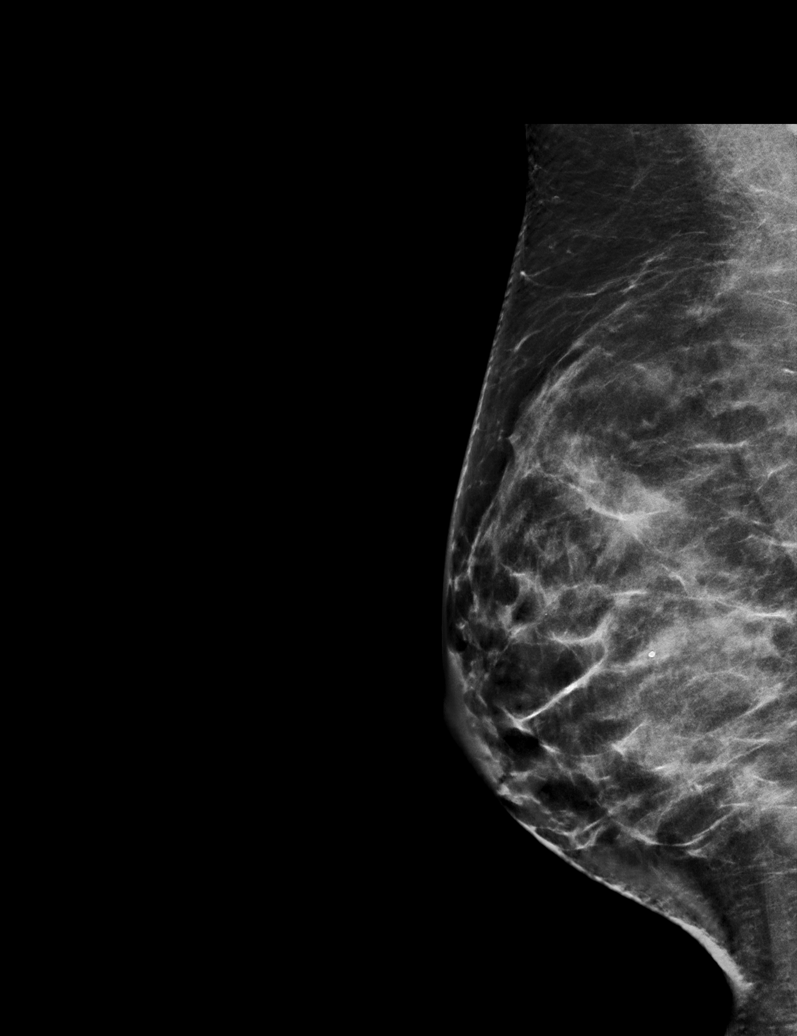

[6 of 36 positions shown; findings below may reference images not displayed]

ACR Breast Density Category c: The breast tissue is heterogeneously
dense, which may obscure small masses.
FINDINGS: There are no findings suspicious for malignancy. Images were
processed with CAD.
IMPRESSION: No mammographic evidence of malignancy. A result letter of this
screening mammogram will be mailed directly to the patient.

RECOMMENDATION:
Screening mammogram in one year. (Code:[id number]-U-LHB)

BI-RADS CATEGORY  1: Negative.

## 2019-11-23 ENCOUNTER — Encounter: Payer: Self-pay | Admitting: Obstetrics and Gynecology

## 2019-11-23 ENCOUNTER — Ambulatory Visit (INDEPENDENT_AMBULATORY_CARE_PROVIDER_SITE_OTHER): Payer: Managed Care, Other (non HMO) | Admitting: Obstetrics and Gynecology

## 2019-11-23 ENCOUNTER — Other Ambulatory Visit: Payer: Self-pay

## 2019-11-23 VITALS — BP 104/62 | HR 67 | Wt 222.0 lb

## 2019-11-23 DIAGNOSIS — Z6833 Body mass index (BMI) 33.0-33.9, adult: Secondary | ICD-10-CM | POA: Diagnosis not present

## 2019-11-23 DIAGNOSIS — E669 Obesity, unspecified: Secondary | ICD-10-CM

## 2019-11-23 DIAGNOSIS — Z01419 Encounter for gynecological examination (general) (routine) without abnormal findings: Secondary | ICD-10-CM | POA: Diagnosis not present

## 2019-11-23 DIAGNOSIS — Z1239 Encounter for other screening for malignant neoplasm of breast: Secondary | ICD-10-CM

## 2019-11-23 DIAGNOSIS — Z124 Encounter for screening for malignant neoplasm of cervix: Secondary | ICD-10-CM

## 2019-11-23 MED ORDER — PHENTERMINE HCL 37.5 MG PO TABS: 37.5000 mg | ORAL_TABLET | Freq: Every day | ORAL | 0 refills | Status: DC

## 2019-11-23 NOTE — Patient Instructions (Signed)
[facility] [address] [place] [address]  [facility] [place]  [address] [place] [address]  Phone: [phone]  

## 2019-11-23 NOTE — Progress Notes (Signed)
Gynecology Annual Exam  PCP: Patient, No Pcp Per  Chief Complaint:  Chief Complaint  Patient presents with  . Gynecologic Exam    History of Present Illness: Patient is a 44 y.o. [id number] presents for annual exam. The patient has no complaints today.   LMP: No LMP recorded. (Menstrual status: IUD). Absent on Mirena IUD  The patient is sexually active. She currently uses IUD for contraception. She denies dyspareunia.  The patient does perform self breast exams.  There is notable family history of breast or ovarian cancer in her family (mom).  The patient wears seatbelts: yes.   The patient has regular exercise: yes.    The patient denies current symptoms of depression.    [name] a 43 y.o. W4X3244 female, who presents for the evaluation of weight gain. She has lost 20lbs over the past 8 months. Was furloughed during the pandemic so has taken some time to work on her health.  However, feels has hit a plateau on weight loss.  The patient has no additional symptoms. The patient specifically denies memory loss, muscle weakness, excessive thirst, and polyuria. Weight related co-morbidities include none. Has tried phentermine in the past with good success.   Review of Systems: Review of Systems  Constitutional: Negative for chills and fever.  HENT: Negative for congestion.   Respiratory: Negative for cough and shortness of breath.   Cardiovascular: Negative for chest pain and palpitations.  Gastrointestinal: Negative for abdominal pain, constipation, diarrhea, heartburn, nausea and vomiting.  Genitourinary: Negative for dysuria, frequency and urgency.  Skin: Negative for itching and rash.  Neurological: Negative for dizziness and headaches.  Endo/Heme/Allergies: Negative for polydipsia.  Psychiatric/Behavioral: Negative for depression.    Past Medical History:  Past Medical History:  Diagnosis Date  . Asthma    exercise induced  . BRCA negative   . Migraine 2014  .  Peripheral vascular disease [facility])     Past Surgical History:  Past Surgical History:  Procedure Laterality Date  . LIPOMA EXCISION N/A 09/09/2018   Procedure: Excision of 6 symptomatic glomus tumors;  Surgeon: Algernon Huxley, MD;  Location: [facility] ORS;  Service: General;  Laterality: N/A;  . MOLE REMOVAL  08/2009,09/2009   Biopsy compound Nevus w/slight-moderate melanocyte Atypia pubic region, excision for negative margins  . TONSILLECTOMY      Gynecologic History:  No LMP recorded. (Menstrual status: IUD). Contraception: 07/16/16 Mirena Last Pap: Results were: 11/16/2018 NIL and HR HPV negative  Last mammogram: 11/18/2018 Results were: BI-RAD I   Obstetric History: [id number]  Family History:  Family History  Problem Relation Age of Onset  . Breast cancer Mother 89  . Melanoma Mother 87       Malignant skin  . Breast cancer Maternal Aunt 7  . Ovarian cancer Other 63  . Breast cancer Maternal Aunt 60    Social History:  Social History   Socioeconomic History  . Marital status: Married    Spouse name: Not on file  . Number of children: Not on file  . Years of education: Not on file  . Highest education level: Not on file  Occupational History  . Not on file  Social Needs  . Financial resource strain: Not on file  . Food insecurity    Worry: Not on file    Inability: Not on file  . Transportation needs    Medical: Not on file    Non-medical: Not on file  Tobacco Use  . Smoking status: Never Smoker  .  Smokeless tobacco: Never Used  Substance and Sexual Activity  . Alcohol use: Not Currently  . Drug use: No  . Sexual activity: Yes    Birth control/protection: I.U.D.  Lifestyle  . Physical activity    Days per week: Not on file    Minutes per session: Not on file  . Stress: Not on file  Relationships  . Social Herbalist on phone: Not on file    Gets together: Not on file    Attends religious service: Not on file    Active member of club or  organization: Not on file    Attends meetings of clubs or organizations: Not on file    Relationship status: Not on file  . Intimate partner violence    Fear of current or ex partner: Not on file    Emotionally abused: Not on file    Physically abused: Not on file    Forced sexual activity: Not on file  Other Topics Concern  . Not on file  Social History Narrative  . Not on file    Allergies:  Allergies  Allergen Reactions  . Azithromycin Anaphylaxis    Medications: Prior to Admission medications   Medication Sig Start Date End Date Taking? Authorizing Provider  Ascorbic Acid (VITAMIN C) 1000 MG tablet Take 1,000 mg by mouth daily.   Yes [provider]  Cholecalciferol (VITAMIN D3 PO) Take 7,500 Int'l Units by mouth daily.   Yes [provider]  levonorgestrel (MIRENA) 20 MCG/24HR IUD 1 each by Intrauterine route once.   Yes [provider]  MAGNESIUM MALATE PO Take 2 capsules by mouth daily.   Yes [provider]  Menaquinone-7 (VITAMIN K2 PO) Take 150 mcg by mouth daily.   Yes [provider]  Misc Natural Products (ADRENAL PO) Take 2 capsules by mouth 2 (two) times daily.   Yes [provider]  Probiotic Product (PROBIOTIC DAILY PO) Take 1 capsule by mouth daily.   Yes [provider]  vitamin A 25000 UNIT capsule Take 25,000 Units by mouth daily.   Yes [provider]    Physical Exam Vitals: Blood pressure 104/62, pulse 67, weight 222 lb (100.7 kg). Body mass index is 33.26 kg/m.  General: NAD HEENT: normocephalic, anicteric Thyroid: no enlargement, no palpable nodules Pulmonary: No increased work of breathing, CTAB Cardiovascular: RRR, distal pulses 2+ Breast: Breast symmetrical, no tenderness, no palpable nodules or masses, no skin or nipple retraction present, no nipple discharge.  No axillary or supraclavicular lymphadenopathy. Abdomen: NABS, soft, non-tender, non-distended.  Umbilicus  without lesions.  No hepatomegaly, splenomegaly or masses palpable. No evidence of hernia  Genitourinary:  External: Normal external female genitalia.  Normal urethral meatus, normal Bartholin's and Skene's glands.    Vagina: Normal vaginal mucosa, no evidence of prolapse.    Cervix: Grossly normal in appearance, no bleeding, IUD strings visualized  Uterus: Non-enlarged, mobile, normal contour.  No CMT  Adnexa: ovaries non-enlarged, no adnexal masses  Rectal: deferred  Lymphatic: no evidence of inguinal lymphadenopathy Extremities: no edema, erythema, or tenderness Neurologic: Grossly intact Psychiatric: mood appropriate, affect full  Female chaperone present for pelvic and breast  portions of the physical exam    Assessment: 43 y.o. [id number] routine annual exam  Plan: Problem List Items Addressed This Visit    None    Visit Diagnoses    Encounter for gynecological examination without abnormal finding    -  Primary   Screening for malignant neoplasm  of cervix       Breast screening       Relevant Orders   MM 3D SCREEN BREAST BILATERAL   Class 1 obesity without serious comorbidity with body mass index (BMI) of 33.0 to 33.9 in adult, unspecified obesity type       Relevant Medications   phentermine (ADIPEX-P) 37.5 MG tablet     Annual  1) Mammogram - recommend yearly screening mammogram.  Mammogram Is up to date   2) STI screening  was notoffered and therefore not obtained  3) ASCCP guidelines and rational discussed.  Patient opts for every 3 years screening interval  4) Contraception - the patient is currently using  IUD.  She is happy with her current form of contraception and plans to continue  5) Colonoscopy -- Screening recommended starting at age 66 for average risk individuals, age 33 for individuals deemed at increased risk (including African Americans) and recommended to continue until age 43.  For patient age 73-85 individualized approach is recommended.  Gold  standard screening is via colonoscopy, Cologuard screening is an acceptable alternative for patient unwilling or unable to undergo colonoscopy.  "Colorectal cancer screening for average?risk adults: 2018 guideline update from the American Cancer Society"CA: A Cancer Journal for Clinicians: May 27, 2017   6) Routine healthcare maintenance including cholesterol, diabetes screening discussed managed by PCP   Obesity   1) 1500 Calorie ADA Diet  2) Patient education given regarding appropriate lifestyle changes for weight loss including: regular physical activity, healthy coping strategies, caloric restriction and healthy eating patterns.  3) Patient will be started on weight loss medication. The risks and benefits and side effects of medication, such as Adipex (Phenteramine) ,  Tenuate (Diethylproprion), Belviq (lorcarsin), Contrave (buproprion/naltrexone), Qsymia (phentermine/topiramate), and Saxenda (liraglutide) is discussed. The pros and cons of suppressing appetite and boosting metabolism is discussed. Risks of tolerence and addiction is discussed for selected agents discussed. Use of medicine will ne short term, such as 3-4 months at a time followed by a period of time off of the medicine to avoid these risks and side effects for Adipex, Qsymia, and Tenuate discussed. Pt to call with any negative side effects and agrees to keep follow up appts.  4) Comorbidity Screening - hypothyroidism screening, diabetes, and hyperlipidemia screening offered  5) Encouraged weekly weight monitorig to track progress and sample 1 week food diary  6) Contraception - discussed that all weight loss drugs fall in to pregnancy category X, patient currently has reliable contraception in the form of Mirena IUD  7) 15 minutes face-to-face; counseling/coordination of care > 50 percent of visit  8) Follow up in 4 weeks to assess response   [name], MD, [place], [place]  11/23/2019, 11:04 AM

## 2019-12-13 ENCOUNTER — Ambulatory Visit
Admission: RE | Admit: 2019-12-13 | Discharge: 2019-12-13 | Disposition: A | Discharge status: Home / Self Care | Payer: Managed Care, Other (non HMO) | Source: Ambulatory Visit | Attending: Obstetrics and Gynecology | Admitting: Obstetrics and Gynecology

## 2019-12-13 DIAGNOSIS — Z1231 Encounter for screening mammogram for malignant neoplasm of breast: Secondary | ICD-10-CM | POA: Diagnosis present

## 2019-12-13 DIAGNOSIS — Z1239 Encounter for other screening for malignant neoplasm of breast: Secondary | ICD-10-CM

## 2019-12-16 ENCOUNTER — Other Ambulatory Visit: Payer: Self-pay

## 2019-12-16 ENCOUNTER — Encounter: Payer: Self-pay | Admitting: Obstetrics and Gynecology

## 2019-12-16 ENCOUNTER — Ambulatory Visit (INDEPENDENT_AMBULATORY_CARE_PROVIDER_SITE_OTHER): Payer: Managed Care, Other (non HMO) | Admitting: Obstetrics and Gynecology

## 2019-12-16 VITALS — Wt 213.0 lb

## 2019-12-16 DIAGNOSIS — Z803 Family history of malignant neoplasm of breast: Secondary | ICD-10-CM

## 2019-12-16 DIAGNOSIS — Z6831 Body mass index (BMI) 31.0-31.9, adult: Secondary | ICD-10-CM | POA: Diagnosis not present

## 2019-12-16 DIAGNOSIS — E669 Obesity, unspecified: Secondary | ICD-10-CM

## 2019-12-16 MED ORDER — PHENTERMINE HCL 37.5 MG PO TABS: 37.5000 mg | ORAL_TABLET | Freq: Every day | ORAL | 0 refills | Status: DC

## 2019-12-16 NOTE — Progress Notes (Signed)
I connected with Diane Reynolds on 12/16/19 at 11:10 AM EST by telephone and verified that I am speaking with the correct person using two identifiers.   I discussed the limitations, risks, security and privacy concerns of performing an evaluation and management service by telephone and the availability of in person appointments. I also discussed with the patient that there may be a patient responsible charge related to this service. The patient expressed understanding and agreed to proceed.  The patient was at home I spoke with the patient from my workstation phone The names of people involved in this encounter were: Diane Reynolds , and [name]   Gynecology Office Visit  Chief Complaint:  Chief Complaint  Patient presents with  . Weight Check    History of Present Illness: [name] a 43 y.o. G15P2002 female, who presents for the evaluation of the desire to lose weight. She has lost 9 pounds 1 months. The patient states the following symptoms since starting her weight loss therapy: appetite suppression, energy, and weight loss.  The patient also reports no other ill effects. The patient specifically denies heart palpitations, anxiety, and insomnia.    Review of Systems: 10 point review of systems negative unless otherwise noted in HPI  Past Medical History:  Past Medical History:  Diagnosis Date  . Asthma    exercise induced  . BRCA negative   . Migraine 2014  . Peripheral vascular disease [facility])     Past Surgical History:  Past Surgical History:  Procedure Laterality Date  . LIPOMA EXCISION N/A 09/09/2018   Procedure: Excision of 6 symptomatic glomus tumors;  Surgeon: Algernon Huxley, MD;  Location: [facility] ORS;  Service: General;  Laterality: N/A;  . MOLE REMOVAL  08/2009,09/2009   Biopsy compound Nevus w/slight-moderate melanocyte Atypia pubic region, excision for negative margins  . TONSILLECTOMY      Gynecologic History: No LMP recorded. (Menstrual status:  IUD).  Obstetric History: [id number]  Family History:  Family History  Problem Relation Age of Onset  . Breast cancer Mother 71  . Melanoma Mother 31       Malignant skin  . Breast cancer Maternal Aunt 50       and ovarian  . Ovarian cancer Other 39       and breast  . Breast cancer Maternal Aunt 60    Social History:  Social History   Socioeconomic History  . Marital status: Married    Spouse name: Not on file  . Number of children: Not on file  . Years of education: Not on file  . Highest education level: Not on file  Occupational History  . Not on file  Tobacco Use  . Smoking status: Never Smoker  . Smokeless tobacco: Never Used  Substance and Sexual Activity  . Alcohol use: Not Currently  . Drug use: No  . Sexual activity: Yes    Birth control/protection: I.U.D.  Other Topics Concern  . Not on file  Social History Narrative  . Not on file   Social Determinants of Health   Financial Resource Strain:   . Difficulty of Paying Living Expenses: Not on file  Food Insecurity:   . Worried About Charity fundraiser in the Last Year: Not on file  . Ran Out of Food in the Last Year: Not on file  Transportation Needs:   . Lack of Transportation (Medical): Not on file  . Lack of Transportation (Non-Medical): Not on file  Physical Activity:   .  Days of Exercise per Week: Not on file  . Minutes of Exercise per Session: Not on file  Stress:   . Feeling of Stress : Not on file  Social Connections:   . Frequency of Communication with Friends and Family: Not on file  . Frequency of Social Gatherings with Friends and Family: Not on file  . Attends Religious Services: Not on file  . Active Member of Clubs or Organizations: Not on file  . Attends Archivist Meetings: Not on file  . Marital Status: Not on file  Intimate Partner Violence:   . Fear of Current or Ex-Partner: Not on file  . Emotionally Abused: Not on file  . Physically Abused: Not on file  .  Sexually Abused: Not on file    Allergies:  Allergies  Allergen Reactions  . Azithromycin Anaphylaxis    Medications: Prior to Admission medications   Medication Sig Start Date End Date Taking? Authorizing Provider  Ascorbic Acid (VITAMIN C) 1000 MG tablet Take 1,000 mg by mouth daily.   Yes [provider]  Cholecalciferol (VITAMIN D3 PO) Take 7,500 Int'l Units by mouth daily.   Yes [provider]  levonorgestrel (MIRENA) 20 MCG/24HR IUD 1 each by Intrauterine route once.   Yes [provider]  MAGNESIUM MALATE PO Take 2 capsules by mouth daily.   Yes [provider]  Menaquinone-7 (VITAMIN K2 PO) Take 150 mcg by mouth daily.   Yes [provider]  Misc Natural Products (ADRENAL PO) Take 2 capsules by mouth 2 (two) times daily.   Yes [provider]  phentermine (ADIPEX-P) 37.5 MG tablet Take 1 tablet (37.5 mg total) by mouth daily before breakfast. 11/23/19  Yes [name], MD  Probiotic Product (PROBIOTIC DAILY PO) Take 1 capsule by mouth daily.   Yes [provider]  vitamin A 25000 UNIT capsule Take 25,000 Units by mouth daily.   Yes [provider]    Physical Exam There were no vitals taken for this visit. Wt: 213lbs Wt Readings from Last 3 Encounters:  12/16/19 213 lb (96.6 kg)  11/23/19 222 lb (100.7 kg)  12/06/18 227 lb (103 kg)  Body mass index is 31.92 kg/m.  No physical exam as this was a remote telephone visit to promote social distancing during the current COVID-19 Pandemic   Assessment: 43 y.o. L3Y1017 follow up medical weight loss  Plan: Problem List Items Addressed This Visit    None    Visit Diagnoses    Class 1 obesity without serious comorbidity with body mass index (BMI) of 31.0 to 31.9 in adult, unspecified obesity type    -  Primary   Relevant Medications   phentermine (ADIPEX-P) 37.5 MG tablet   Family history of breast cancer          1) 1500 Calorie ADA Diet   2) Patient education given regarding appropriate lifestyle changes for weight loss including: regular physical activity, healthy coping strategies, caloric restriction and healthy eating patterns.  3)  Patient to take medication, with the benefits of appetite suppression and metabolism boost d/w pt, along with the side effects and risk factors of long term use that will be avoided with our use of short bursts of therapy. Rx provided.    4) Breast cancer family history - has had BRCA testing via [facility] but has not had extended panel testing.  Discussed bringing testing up to date by obtaining [facility] add on   5) Telephone Time 11:30  6)  Return in about 4 weeks (around 01/13/2020) for BRCA lab testing sometine in the next weeks, phone or video visit weight loss in 4 weeks.    [name], MD, [facility], [facility] 12/16/2019, 4:15 PM

## 2019-12-20 ENCOUNTER — Ambulatory Visit: Payer: Managed Care, Other (non HMO)

## 2019-12-20 ENCOUNTER — Other Ambulatory Visit: Payer: Self-pay

## 2019-12-20 DIAGNOSIS — Z803 Family history of malignant neoplasm of breast: Secondary | ICD-10-CM

## 2019-12-28 ENCOUNTER — Encounter: Payer: Self-pay | Admitting: Obstetrics and Gynecology

## 2020-01-20 ENCOUNTER — Other Ambulatory Visit: Payer: Self-pay

## 2020-01-20 ENCOUNTER — Telehealth (INDEPENDENT_AMBULATORY_CARE_PROVIDER_SITE_OTHER): Payer: Managed Care, Other (non HMO) | Admitting: Obstetrics and Gynecology

## 2020-01-20 VITALS — Wt 207.0 lb

## 2020-01-20 DIAGNOSIS — J019 Acute sinusitis, unspecified: Secondary | ICD-10-CM | POA: Diagnosis not present

## 2020-01-20 DIAGNOSIS — E669 Obesity, unspecified: Secondary | ICD-10-CM | POA: Diagnosis not present

## 2020-01-20 DIAGNOSIS — Z6831 Body mass index (BMI) 31.0-31.9, adult: Secondary | ICD-10-CM

## 2020-01-20 MED ORDER — AMOXICILLIN-POT CLAVULANATE 875-125 MG PO TABS: 1.0000 | ORAL_TABLET | Freq: Two times a day (BID) | ORAL | 0 refills | Status: AC

## 2020-01-20 MED ORDER — PHENTERMINE HCL 37.5 MG PO TABS: 37.5000 mg | ORAL_TABLET | Freq: Every day | ORAL | 0 refills | Status: DC

## 2020-01-20 NOTE — Progress Notes (Signed)
I connected with Diane Reynolds on 01/20/20 at 10:10 AM EST by telephone and verified that I am speaking with the correct person using two identifiers.   I discussed the limitations, risks, security and privacy concerns of performing an evaluation and management service by telephone and the availability of in person appointments. I also discussed with the patient that there may be a patient responsible charge related to this service. The patient expressed understanding and agreed to proceed.  The patient was at home I spoke with the patient from my workstation phone The names of people involved in this encounter were: Diane Reynolds , and Diane Reynolds   Gynecology Office Visit  Chief Complaint: No chief complaint on file.   History of Present Illness: [name] a 44 y.o. G70P2002 female, who presents for the evaluation of the desire to lose weight. She has lost 6 pounds 1 months. The patient states the following symptoms since starting her weight loss therapy: appetite suppression, energy, and weight loss.  The patient also reports no other ill effects. The patient specifically denies heart palpitations, anxiety, and insomnia.   Reports sinusitis symptoms since 01/16/2020, no fevers, chills, body aches, or covid exposures.     Review of Systems: 10 point review of systems negative unless otherwise noted in HPI  Past Medical History:  Past Medical History:  Diagnosis Date  . Asthma    exercise induced  . BRCA negative 03/2013  . Migraine 2014  . Peripheral vascular disease [facility])     Past Surgical History:  Past Surgical History:  Procedure Laterality Date  . LIPOMA EXCISION N/A 09/09/2018   Procedure: Excision of 6 symptomatic glomus tumors;  Surgeon: Algernon Huxley, MD;  Location: [facility] ORS;  Service: General;  Laterality: N/A;  . MOLE REMOVAL  08/2009,09/2009   Biopsy compound Nevus w/slight-moderate melanocyte Atypia pubic region, excision for negative margins  .  TONSILLECTOMY      Gynecologic History: No LMP recorded. (Menstrual status: IUD).  Obstetric History: [id number]  Family History:  Family History  Problem Relation Age of Onset  . Breast cancer Mother 71  . Melanoma Mother 35       Malignant skin  . Breast cancer Maternal Aunt 50       and ovarian  . Breast cancer Maternal Aunt 60  . Ovarian cancer Maternal Aunt     Social History:  Social History   Socioeconomic History  . Marital status: Married    Spouse name: Not on file  . Number of children: Not on file  . Years of education: Not on file  . Highest education level: Not on file  Occupational History  . Not on file  Tobacco Use  . Smoking status: Never Smoker  . Smokeless tobacco: Never Used  Substance and Sexual Activity  . Alcohol use: Not Currently  . Drug use: No  . Sexual activity: Yes    Birth control/protection: I.U.D.  Other Topics Concern  . Not on file  Social History Narrative  . Not on file   Social Determinants of Health   Financial Resource Strain:   . Difficulty of Paying Living Expenses: Not on file  Food Insecurity:   . Worried About Charity fundraiser in the Last Year: Not on file  . Ran Out of Food in the Last Year: Not on file  Transportation Needs:   . Lack of Transportation (Medical): Not on file  . Lack of Transportation (Non-Medical): Not on file  Physical Activity:   . Days of Exercise per Week: Not on file  . Minutes of Exercise per Session: Not on file  Stress:   . Feeling of Stress : Not on file  Social Connections:   . Frequency of Communication with Friends and Family: Not on file  . Frequency of Social Gatherings with Friends and Family: Not on file  . Attends Religious Services: Not on file  . Active Member of Clubs or Organizations: Not on file  . Attends Archivist Meetings: Not on file  . Marital Status: Not on file  Intimate Partner Violence:   . Fear of Current or Ex-Partner: Not on file  . Emotionally  Abused: Not on file  . Physically Abused: Not on file  . Sexually Abused: Not on file    Allergies:  Allergies  Allergen Reactions  . Azithromycin Anaphylaxis    Medications: Prior to Admission medications   Medication Sig Start Date End Date Taking? Authorizing Provider  Ascorbic Acid (VITAMIN C) 1000 MG tablet Take 1,000 mg by mouth daily.    [provider]  Cholecalciferol (VITAMIN D3 PO) Take 7,500 Int'l Units by mouth daily.    [provider]  levonorgestrel (MIRENA) 20 MCG/24HR IUD 1 each by Intrauterine route once.    [provider]  MAGNESIUM MALATE PO Take 2 capsules by mouth daily.    [provider]  Menaquinone-7 (VITAMIN K2 PO) Take 150 mcg by mouth daily.    [provider]  Misc Natural Products (ADRENAL PO) Take 2 capsules by mouth 2 (two) times daily.    [provider]  phentermine (ADIPEX-P) 37.5 MG tablet Take 1 tablet (37.5 mg total) by mouth daily before breakfast. 12/16/19   Diane Mood, MD  Probiotic Product (PROBIOTIC DAILY PO) Take 1 capsule by mouth daily.    [provider]  vitamin A 25000 UNIT capsule Take 25,000 Units by mouth daily.    [provider]    Physical Exam There were no vitals taken for this visit. Wt Readings from Last 3 Encounters:  01/20/20 207 lb (93.9 kg)  12/16/19 213 lb (96.6 kg)  11/23/19 222 lb (100.7 kg)  Body mass index is 31.02 kg/m.  No physical exam as this was a remote telephone visit to promote social distancing during the current COVID-19 Pandemic   Assessment: 44 y.o. J6O1157 medical weight loss, sinusitis  Plan: Problem List Items Addressed This Visit    None    Visit Diagnoses    Acute non-recurrent sinusitis, unspecified location    -  Primary   Relevant Medications   amoxicillin-clavulanate (AUGMENTIN) 875-125 MG tablet   Class 1 obesity without serious comorbidity with body mass index (BMI) of 31.0 to 31.9 in adult,  unspecified obesity type       Relevant Medications   phentermine (ADIPEX-P) 37.5 MG tablet      1) 1500 Calorie ADA Diet  2) Patient education given regarding appropriate lifestyle changes for weight loss including: regular physical activity, healthy coping strategies, caloric restriction and healthy eating patterns.  3) Patient will be started on weight loss medication. The risks and benefits and side effects of medication, such as Adipex (Phenteramine) ,  Tenuate (Diethylproprion), Belviq (lorcarsin), Contrave (buproprion/naltrexone), Qsymia (phentermine/topiramate), and Saxenda (liraglutide) is discussed. The pros and cons of suppressing appetite and boosting metabolism is discussed. Risks of tolerence and addiction is discussed for selected agents discussed. Use of medicine will ne short term, such as 3-4 months at  a time followed by a period of time off of the medicine to avoid these risks and side effects for Adipex, Qsymia, and Tenuate discussed. Pt to call with any negative side effects and agrees to keep follow up appts.  4) Patient to take medication, with the benefits of appetite suppression and metabolism boost d/w pt, along with the side effects and risk factors of long term use that will be avoided with our use of short bursts of therapy. Rx provided.    5) Telephone 8:05  6)  Return in about 4 weeks (around 02/17/2020) for medication.    Diane Mood, MD, [facility], [facility] 01/20/2020, 10:58 AM

## 2020-02-17 ENCOUNTER — Telehealth: Payer: Managed Care, Other (non HMO) | Admitting: Obstetrics and Gynecology

## 2020-03-06 ENCOUNTER — Telehealth: Payer: Self-pay | Admitting: Obstetrics and Gynecology

## 2020-03-06 NOTE — Telephone Encounter (Signed)
Spoke with pt re: [facility] Update testing. Pt was BRCA neg 2014. Tried to do update testing end of 2020 to avoid any out of pocket expense since deductible met. Pt needed to talk to [facility] so never got started till 1/21. Pt never talked to [facility] and [facility] canceled test 02/22/20.  Pt still interested in testing, doesn't want a bill. Told her update testing was currently free of charge through [facility]. Also, she will get text/email about any possible charge after insurance is processed. Pt will call [facility] per [facility] to restart process. Pt can also then call to make sure no OOP expense to her. [facility] notified by me. Will call pt with results.

## 2020-03-28 NOTE — Progress Notes (Signed)
Obstetrics & Gynecology Office Visit   Chief Complaint:  Chief Complaint  Patient presents with   IUD string check    vaginal bleeding x3 weeks/Strings are longer upon checking    History of Present Illness: 44 y.o. patient presenting for follow up of Mirena IUD placement 07/16/2016.  The indication for her IUD was contraception.  She admits to any complications since her IUD placement.  is able to feel strings.  However, recently noted some heavier bleeding and noted string to suddenly feel longer than previously.  She has had some light cramping since that time over the past week.    Review of Systems: Review of Systems  Constitutional: Negative.   Gastrointestinal: Negative.   Genitourinary: Negative.     Past Medical History:  Past Medical History:  Diagnosis Date   Asthma    exercise induced   BRCA negative 03/2013   Migraine 2014   Peripheral vascular disease ([place])     Past Surgical History:  Past Surgical History:  Procedure Laterality Date   LIPOMA EXCISION N/A 09/09/2018   Procedure: Excision of 6 symptomatic glomus tumors;  Surgeon: Algernon Huxley, MD;  Location: [facility] ORS;  Service: General;  Laterality: N/A;   MOLE REMOVAL  08/2009,09/2009   Biopsy compound Nevus w/slight-moderate melanocyte Atypia pubic region, excision for negative margins   TONSILLECTOMY      Gynecologic History: No LMP recorded. (Menstrual status: IUD).  Obstetric History: [id number]  Family History:  Family History  Problem Relation Age of Onset   Breast cancer Mother 48   Melanoma Mother 20       Malignant skin   Breast cancer Maternal Aunt 50       and ovarian   Breast cancer Maternal Aunt 60   Ovarian cancer Maternal Aunt     Social History:  Social History   Socioeconomic History   Marital status: Married    Spouse name: Not on file   Number of children: Not on file   Years of education: Not on file   Highest education level: Not on file  Occupational  History   Not on file  Tobacco Use   Smoking status: Never Smoker   Smokeless tobacco: Never Used  Substance and Sexual Activity   Alcohol use: Not Currently   Drug use: No   Sexual activity: Yes    Birth control/protection: I.U.D.  Other Topics Concern   Not on file  Social History Narrative   Not on file   Social Determinants of Health   Financial Resource Strain:    Difficulty of Paying Living Expenses:   Food Insecurity:    Worried About Charity fundraiser in the Last Year:    Arboriculturist in the Last Year:   Transportation Needs:    Film/video editor (Medical):    Lack of Transportation (Non-Medical):   Physical Activity:    Days of Exercise per Week:    Minutes of Exercise per Session:   Stress:    Feeling of Stress :   Social Connections:    Frequency of Communication with Friends and Family:    Frequency of Social Gatherings with Friends and Family:    Attends Religious Services:    Active Member of Clubs or Organizations:    Attends Archivist Meetings:    Marital Status:   Intimate Partner Violence:    Fear of Current or Ex-Partner:    Emotionally Abused:    Physically Abused:  Sexually Abused:     Allergies:  Allergies  Allergen Reactions   Azithromycin Anaphylaxis    Medications: Prior to Admission medications   Medication Sig Start Date End Date Taking? Authorizing Provider  Ascorbic Acid (VITAMIN C) 1000 MG tablet Take 1,000 mg by mouth daily.    [provider]  Cholecalciferol (VITAMIN D3 PO) Take 7,500 Int'l Units by mouth daily.    [provider]  levonorgestrel (MIRENA) 20 MCG/24HR IUD 1 each by Intrauterine route once.    [provider]  MAGNESIUM MALATE PO Take 2 capsules by mouth daily.    [provider]  Menaquinone-7 (VITAMIN K2 PO) Take 150 mcg by mouth daily.    [provider]  Misc Natural Products (ADRENAL PO) Take 2 capsules by mouth 2  (two) times daily.    [provider]  phentermine (ADIPEX-P) 37.5 MG tablet Take 1 tablet (37.5 mg total) by mouth daily before breakfast. 01/20/20   [name], MD  Probiotic Product (PROBIOTIC DAILY PO) Take 1 capsule by mouth daily.    [provider]  vitamin A 25000 UNIT capsule Take 25,000 Units by mouth daily.    [provider]    Physical Exam There were no vitals taken for this visit. No LMP recorded. (Menstrual status: IUD).  General: NAD HEENT: normocephalic, anicteric Pulmonary: No increased work of breathing  Genitourinary:  External: Normal external female genitalia.  Normal urethral meatus, normal Bartholin's and Skene's glands.    Vagina: Normal vaginal mucosa, no evidence of prolapse.    Cervix: Grossly normal in appearance, no bleeding, IUD strings visualized 5cm, the body of the IUD is not visibile at the external cervical os but is palpable using a cervical brush  Uterus: Non-enlarged, mobile, normal contour.  No CMT  Adnexa: ovaries non-enlarged, no adnexal masses  Rectal: deferred  Lymphatic: no evidence of inguinal lymphadenopathy Extremities: no edema, erythema, or tenderness Neurologic: Grossly intact Psychiatric: mood appropriate, affect full  Female chaperone present for pelvic and breast  portions of the physical exam     GYNECOLOGY OFFICE PROCEDURE NOTE  Diane Reynolds is a 44 y.o. [id number] here for IUD removal and reinsertion. The patient currently has a Mirena IUD placed in 2017 which will be replaced with a Mirena IUD today secondary to displacement of the original IUD.  No GYN concerns.  Last pap smear was on 11/16/2018 and was normal.  IUD Removal and Reinsertion  Patient identified, informed consent performed, consent signed.   Discussed risks of irregular bleeding, cramping, infection, malpositioning or uterine perforation of the IUD which may require further procedures. Time out was performed. Speculum placed  in the vagina. The strings of the IUD were grasped and pulled using ring forceps. The IUD was successfully removed in its entirety. The cervix was cleaned with Betadine x 2 and grasped anteriorly with a single tooth tenaculum.  The uterus was sounded to IUD insertion apparatus was used to sound the uterus to 8 cm using a uterine sound.  The IUD was then placed per manufacturer's recommendations. Strings trimmed to 3 cm. Tenaculum was removed, good hemostasis noted. Patient tolerated procedure well.   Patient was given post-procedure instructions.  Patient was also asked to check IUD strings periodically and follow up in 6 weeks for IUD check.    Assessment: 44 y.o. [id number] IUD disiplacement  Plan: Problem List Items Addressed This Visit    None    Visit Diagnoses    IUD check up    -  Primary   Displacement of intrauterine contraceptive device, initial encounter       Encounter for IUD removal and reinsertion           1.  The patient was given instructions to check her IUD strings monthly and call with any problems or concerns.  She should call for fevers, chills, abnormal vaginal discharge, pelvic pain, or other complaints.  2.   IUDs while effective at preventing pregnancy do not prevent transmission of sexually transmitted diseases and use of barrier methods for this purpose was discussed.  Low overall incidence of failure with 99.7% efficacy rate in typical use.  The patient has not contraindication to IUD placement.  3.  She will return for a annual exam in 1 year.  All questions answered.  4) A total of 15 minutes were spent in face-to-face contact with the patient during this encounter with over half of that time devoted to counseling and coordination of care.  5) Return in about 6 weeks (around 05/10/2020) for IUD string check, weight loss.   [name], MD, [facility], [place]

## 2020-03-29 ENCOUNTER — Other Ambulatory Visit: Payer: Self-pay

## 2020-03-29 ENCOUNTER — Ambulatory Visit (INDEPENDENT_AMBULATORY_CARE_PROVIDER_SITE_OTHER): Payer: Managed Care, Other (non HMO) | Admitting: Obstetrics and Gynecology

## 2020-03-29 ENCOUNTER — Encounter: Payer: Self-pay | Admitting: Obstetrics and Gynecology

## 2020-03-29 VITALS — BP 106/76 | Wt 205.0 lb

## 2020-03-29 DIAGNOSIS — Z30433 Encounter for removal and reinsertion of intrauterine contraceptive device: Secondary | ICD-10-CM | POA: Diagnosis not present

## 2020-03-29 DIAGNOSIS — N938 Other specified abnormal uterine and vaginal bleeding: Secondary | ICD-10-CM

## 2020-03-29 DIAGNOSIS — T8332XA Displacement of intrauterine contraceptive device, initial encounter: Secondary | ICD-10-CM | POA: Diagnosis not present

## 2020-03-29 DIAGNOSIS — Z30431 Encounter for routine checking of intrauterine contraceptive device: Secondary | ICD-10-CM

## 2020-05-10 ENCOUNTER — Ambulatory Visit (INDEPENDENT_AMBULATORY_CARE_PROVIDER_SITE_OTHER): Payer: Managed Care, Other (non HMO) | Admitting: Obstetrics and Gynecology

## 2020-05-10 ENCOUNTER — Other Ambulatory Visit: Payer: Self-pay

## 2020-05-10 ENCOUNTER — Encounter: Payer: Self-pay | Admitting: Obstetrics and Gynecology

## 2020-05-10 VITALS — BP 120/80 | Ht 68.0 in | Wt 207.0 lb

## 2020-05-10 DIAGNOSIS — Z6831 Body mass index (BMI) 31.0-31.9, adult: Secondary | ICD-10-CM | POA: Diagnosis not present

## 2020-05-10 DIAGNOSIS — E669 Obesity, unspecified: Secondary | ICD-10-CM

## 2020-05-10 DIAGNOSIS — Z30431 Encounter for routine checking of intrauterine contraceptive device: Secondary | ICD-10-CM | POA: Diagnosis not present

## 2020-05-10 MED ORDER — PHENTERMINE HCL 37.5 MG PO TABS: 37.5000 mg | ORAL_TABLET | Freq: Every day | ORAL | 0 refills | Status: DC

## 2020-05-10 NOTE — Progress Notes (Signed)
Obstetrics & Gynecology Office Visit   Chief Complaint:  Chief Complaint  Patient presents with  . Contraception  . Weight Loss    History of Present Illness: 44 y.o. patient presenting for follow up of Mirena IUD placement 4 weeks ago.  The indication for her IUD was contraception.  She denies any complications since her IUD placement.  Still having some occasional spotting, had some heavier bleeding two weeks ago. She  is able to feel strings.    [name] a 44 y.o. G32P2002 female, who presents for the evaluation of the desire to lose weight. She has lost 0 pounds in 1 months. The patient states the following symptoms since starting her weight loss therapy: appetite suppression, energy, and weight loss.  The patient also reports no other ill effects. The patient specifically denies heart palpitations, anxiety, and insomnia.    Review of Systems: Review of Systems  Constitutional: Negative.   Gastrointestinal: Negative.   Genitourinary: Negative.     Past Medical History:  Past Medical History:  Diagnosis Date  . Asthma    exercise induced  . BRCA negative 03/2013  . Migraine 2014  . Peripheral vascular disease [facility])     Past Surgical History:  Past Surgical History:  Procedure Laterality Date  . LIPOMA EXCISION N/A 09/09/2018   Procedure: Excision of 6 symptomatic glomus tumors;  Surgeon: Algernon Huxley, MD;  Location: [facility] ORS;  Service: General;  Laterality: N/A;  . MOLE REMOVAL  08/2009,09/2009   Biopsy compound Nevus w/slight-moderate melanocyte Atypia pubic region, excision for negative margins  . TONSILLECTOMY      Gynecologic History: No LMP recorded. (Menstrual status: IUD).  Obstetric History: [id number]  Family History:  Family History  Problem Relation Age of Onset  . Breast cancer Mother 38  . Melanoma Mother 72       Malignant skin  . Breast cancer Maternal Aunt 50       and ovarian  . Breast cancer Maternal Aunt 60  . Ovarian cancer Maternal Aunt      Social History:  Social History   Socioeconomic History  . Marital status: Married    Spouse name: Not on file  . Number of children: Not on file  . Years of education: Not on file  . Highest education level: Not on file  Occupational History  . Not on file  Tobacco Use  . Smoking status: Never Smoker  . Smokeless tobacco: Never Used  Substance and Sexual Activity  . Alcohol use: Not Currently  . Drug use: No  . Sexual activity: Yes    Birth control/protection: I.U.D.  Other Topics Concern  . Not on file  Social History Narrative  . Not on file   Social Determinants of Health   Financial Resource Strain:   . Difficulty of Paying Living Expenses:   Food Insecurity:   . Worried About Charity fundraiser in the Last Year:   . Arboriculturist in the Last Year:   Transportation Needs:   . Film/video editor (Medical):   Marland Kitchen Lack of Transportation (Non-Medical):   Physical Activity:   . Days of Exercise per Week:   . Minutes of Exercise per Session:   Stress:   . Feeling of Stress :   Social Connections:   . Frequency of Communication with Friends and Family:   . Frequency of Social Gatherings with Friends and Family:   . Attends Religious Services:   . Active Member  of Clubs or Organizations:   . Attends Archivist Meetings:   Marland Kitchen Marital Status:   Intimate Partner Violence:   . Fear of Current or Ex-Partner:   . Emotionally Abused:   Marland Kitchen Physically Abused:   . Sexually Abused:     Allergies:  Allergies  Allergen Reactions  . Azithromycin Anaphylaxis    Medications: Prior to Admission medications   Medication Sig Start Date End Date Taking? Authorizing Provider  Ascorbic Acid (VITAMIN C) 1000 MG tablet Take 1,000 mg by mouth daily.   Yes [provider]  Cholecalciferol (VITAMIN D3 PO) Take 7,500 Int'l Units by mouth daily.   Yes [provider]  levonorgestrel (MIRENA) 20 MCG/24HR IUD 1 each by Intrauterine route once.   Yes  [provider]  Menaquinone-7 (VITAMIN K2 PO) Take 150 mcg by mouth daily.   Yes [provider]  vitamin A 25000 UNIT capsule Take 25,000 Units by mouth daily.   Yes [provider]  MAGNESIUM MALATE PO Take 2 capsules by mouth daily.    [provider]  Misc Natural Products (ADRENAL PO) Take 2 capsules by mouth 2 (two) times daily.    [provider]  phentermine (ADIPEX-P) 37.5 MG tablet Take 1 tablet (37.5 mg total) by mouth daily before breakfast. Patient not taking: Reported on 05/10/2020 01/20/20   [name], MD  Probiotic Product (PROBIOTIC DAILY PO) Take 1 capsule by mouth daily.    [provider]    Physical Exam Blood pressure 120/80, height 5' 8"  (1.727 m), weight 207 lb (93.9 kg). No LMP recorded. (Menstrual status: IUD). Body mass index is 31.47 kg/m.  General: NAD HEENT: normocephalic, anicteric Pulmonary: No increased work of breathing  Genitourinary:  External: Normal external female genitalia.  Normal urethral meatus, normal  Bartholin's and Skene's glands.    Vagina: Normal vaginal mucosa, no evidence of prolapse.    Cervix: Grossly normal in appearance, no bleeding, IUD strings visualized 4cm trimed to 3cm  Uterus: Non-enlarged, mobile, normal contour.  No CMT  Adnexa: ovaries non-enlarged, no adnexal masses  Rectal: deferred  Lymphatic: no evidence of inguinal lymphadenopathy Extremities: no edema, erythema, or tenderness Neurologic: Grossly intact Psychiatric: mood appropriate, affect full  Female chaperone present for pelvic and breast  portions of the physical exam  Assessment: 44 y.o. G2P2002 IUD string check medication follow up medical weight loss  Plan: Problem List Items Addressed This Visit    None    Visit Diagnoses    Class 1 obesity without serious comorbidity with body mass index (BMI) of 31.0 to 31.9 in adult, unspecified obesity type    -  Primary   Relevant Medications    phentermine (ADIPEX-P) 37.5 MG tablet   IUD check up           1.  The patient was given instructions to check her IUD strings monthly and call with any problems or concerns.  She should call for fevers, chills, abnormal vaginal discharge, pelvic pain, or other complaints.  2.   IUDs while effective at preventing pregnancy do not prevent transmission of sexually transmitted diseases and use of barrier methods for this purpose was discussed.  Low overall incidence of failure with 99.7% efficacy rate in typical use.  The patient has not contraindication to IUD placement.  3.  She will return for a annual exam in 1 year.  All questions answered.  4) Phentermine - is noting apettite suppression.  Will continue for one additional month  5) A total of 15 minutes were spent in face-to-face contact with the patient during this encounter with over half of that time devoted to counseling and coordination of care.  6) Return in about 4 weeks (around 06/07/2020) for medication follow up (phone).   [name], MD, [facility], [facility] 05/10/2020, 9:40 AM

## 2020-06-08 ENCOUNTER — Other Ambulatory Visit: Payer: Self-pay

## 2020-06-08 ENCOUNTER — Ambulatory Visit (INDEPENDENT_AMBULATORY_CARE_PROVIDER_SITE_OTHER): Payer: Managed Care, Other (non HMO) | Admitting: Obstetrics and Gynecology

## 2020-06-08 VITALS — Wt 197.0 lb

## 2020-06-08 DIAGNOSIS — E663 Overweight: Secondary | ICD-10-CM

## 2020-06-08 DIAGNOSIS — Z6829 Body mass index (BMI) 29.0-29.9, adult: Secondary | ICD-10-CM | POA: Diagnosis not present

## 2020-06-08 MED ORDER — PHENTERMINE HCL 37.5 MG PO TABS: 37.5000 mg | ORAL_TABLET | Freq: Every day | ORAL | 0 refills | Status: DC

## 2020-06-08 NOTE — Progress Notes (Signed)
I connected with Diane Reynolds on 06/08/20 at 10:30 AM EDT by telephone and verified that I am speaking with the correct person using two identifiers.   I discussed the limitations, risks, security and privacy concerns of performing an evaluation and management service by telephone and the availability of in person appointments. I also discussed with the patient that there may be a patient responsible charge related to this service. The patient expressed understanding and agreed to proceed.  The patient was at home I spoke with the patient from my workstation phone The names of people involved in this encounter were: Diane Reynolds , and Diane Reynolds   Gynecology Office Visit  Chief Complaint: No chief complaint on file.   History of Present Illness: [name] a 44 y.o. G83P2002 female, who presents for the evaluation of the desire to lose weight. She has lost 10 pounds 1 months. The patient states the following symptoms since starting her weight loss therapy: appetite suppression, energy, and weight loss.  The patient also reports no other ill effects. The patient specifically denies heart palpitations, anxiety, and insomnia.   Review of Systems: 10 point review of systems negative unless otherwise noted in HPI  Past Medical History:  Past Medical History:  Diagnosis Date  . Asthma    exercise induced  . BRCA negative 03/2013  . Migraine 2014  . Peripheral vascular disease [facility])     Past Surgical History:  Past Surgical History:  Procedure Laterality Date  . LIPOMA EXCISION N/A 09/09/2018   Procedure: Excision of 6 symptomatic glomus tumors;  Surgeon: Algernon Huxley, MD;  Location: [facility] ORS;  Service: General;  Laterality: N/A;  . MOLE REMOVAL  08/2009,09/2009   Biopsy compound Nevus w/slight-moderate melanocyte Atypia pubic region, excision for negative margins  . TONSILLECTOMY      Gynecologic History: No LMP recorded. (Menstrual status: IUD).  Obstetric History:  [id number]  Family History:  Family History  Problem Relation Age of Onset  . Breast cancer Mother 40  . Melanoma Mother 9       Malignant skin  . Breast cancer Maternal Aunt 50       and ovarian  . Breast cancer Maternal Aunt 60  . Ovarian cancer Maternal Aunt     Social History:  Social History   Socioeconomic History  . Marital status: Married    Spouse name: Not on file  . Number of children: Not on file  . Years of education: Not on file  . Highest education level: Not on file  Occupational History  . Not on file  Tobacco Use  . Smoking status: Never Smoker  . Smokeless tobacco: Never Used  Vaping Use  . Vaping Use: Never used  Substance and Sexual Activity  . Alcohol use: Not Currently  . Drug use: No  . Sexual activity: Yes    Birth control/protection: I.U.D.  Other Topics Concern  . Not on file  Social History Narrative  . Not on file   Social Determinants of Health   Financial Resource Strain:   . Difficulty of Paying Living Expenses:   Food Insecurity:   . Worried About Charity fundraiser in the Last Year:   . Arboriculturist in the Last Year:   Transportation Needs:   . Film/video editor (Medical):   Marland Kitchen Lack of Transportation (Non-Medical):   Physical Activity:   . Days of Exercise per Week:   . Minutes of Exercise per Session:  Stress:   . Feeling of Stress :   Social Connections:   . Frequency of Communication with Friends and Family:   . Frequency of Social Gatherings with Friends and Family:   . Attends Religious Services:   . Active Member of Clubs or Organizations:   . Attends Archivist Meetings:   Marland Kitchen Marital Status:   Intimate Partner Violence:   . Fear of Current or Ex-Partner:   . Emotionally Abused:   Marland Kitchen Physically Abused:   . Sexually Abused:     Allergies:  Allergies  Allergen Reactions  . Azithromycin Anaphylaxis    Medications: Prior to Admission medications   Medication Sig Start Date End Date Taking?  Authorizing Provider  Ascorbic Acid (VITAMIN C) 1000 MG tablet Take 1,000 mg by mouth daily.    [provider]  Cholecalciferol (VITAMIN D3 PO) Take 7,500 Int'l Units by mouth daily.    [provider]  levonorgestrel (MIRENA) 20 MCG/24HR IUD 1 each by Intrauterine route once.    [provider]  MAGNESIUM MALATE PO Take 2 capsules by mouth daily.    [provider]  Menaquinone-7 (VITAMIN K2 PO) Take 150 mcg by mouth daily.    [provider]  Misc Natural Products (ADRENAL PO) Take 2 capsules by mouth 2 (two) times daily.    [provider]  phentermine (ADIPEX-P) 37.5 MG tablet Take 1 tablet (37.5 mg total) by mouth daily before breakfast. 05/10/20   Diane Mood, MD  Probiotic Product (PROBIOTIC DAILY PO) Take 1 capsule by mouth daily.    [provider]  vitamin A 25000 UNIT capsule Take 25,000 Units by mouth daily.    [provider]    Physical Exam There were no vitals taken for this visit. Wt Readings from Last 3 Encounters:  06/08/20 197 lb (89.4 kg)  05/10/20 207 lb (93.9 kg)  03/29/20 205 lb (93 kg)  Body mass index is 29.95 kg/m.  No physical exam as this was a remote telephone visit to promote social distancing during the current COVID-19 Pandemic  Assessment: 44 y.o. [id number] follow up medical weight loss  Plan: Problem List Items Addressed This Visit    None    Visit Diagnoses    Overweight (BMI 25.0-29.9)    -  Primary   BMI 29.0-29.9,adult          1) 1500 Calorie ADA Diet  2) Patient education given regarding appropriate lifestyle changes for weight loss including: regular physical activity, healthy coping strategies, caloric restriction and healthy eating patterns.  3) Patient to take medication, with the benefits of appetite suppression and metabolism boost d/w pt, along with the side effects and risk factors of long term use that will be avoided with our use of short bursts of  therapy. Rx provided.    4) Telephone Time: 11:20 MINUTES  5)  Return in about 4 weeks (around 07/06/2020) for [place].     Diane Mood, MD, [facility], [facility] 06/08/2020, 10:37 AM

## 2020-07-09 ENCOUNTER — Encounter: Payer: Self-pay | Admitting: Obstetrics and Gynecology

## 2020-07-09 ENCOUNTER — Ambulatory Visit (INDEPENDENT_AMBULATORY_CARE_PROVIDER_SITE_OTHER): Payer: Managed Care, Other (non HMO) | Admitting: Obstetrics and Gynecology

## 2020-07-09 ENCOUNTER — Other Ambulatory Visit: Payer: Self-pay

## 2020-07-09 VITALS — Ht 68.0 in | Wt 195.0 lb

## 2020-07-09 DIAGNOSIS — E663 Overweight: Secondary | ICD-10-CM

## 2020-07-09 DIAGNOSIS — Z6829 Body mass index (BMI) 29.0-29.9, adult: Secondary | ICD-10-CM

## 2020-07-09 DIAGNOSIS — Z5329 Procedure and treatment not carried out because of patient's decision for other reasons: Secondary | ICD-10-CM

## 2020-07-09 MED ORDER — PHENTERMINE HCL 37.5 MG PO TABS: 37.5000 mg | ORAL_TABLET | Freq: Every day | ORAL | 0 refills | Status: AC

## 2020-07-09 NOTE — Progress Notes (Signed)
I connected with Diane Reynolds on 07/09/20 at 10:50 AM EDT by telephone and verified that I am speaking with the correct person using two identifiers.   I discussed the limitations, risks, security and privacy concerns of performing an evaluation and management service by telephone and the availability of in person appointments. I also discussed with the patient that there may be a patient responsible charge related to this service. The patient expressed understanding and agreed to proceed.  The patient was at home I spoke with the patient from my workstation phone The names of people involved in this encounter were: Diane Reynolds , and [name]   Gynecology Office Visit  Chief Complaint:  Chief Complaint  Patient presents with  . Weight Check    History of Present Illness: [name] a 44 y.o. G25P2002 female, who presents for the evaluation of the desire to lose weight. She has lost 2 pounds 1 months. The patient states the following symptoms since starting her weight loss therapy: appetite suppression, energy, and weight loss.  The patient also reports no other ill effects. The patient specifically denies heart palpitations, anxiety, and insomnia.   Review of Systems: 10 point review of systems negative unless otherwise noted in HPI  Past Medical History:  Past Medical History:  Diagnosis Date  . Asthma    exercise induced  . BRCA negative 03/2013  . Migraine 2014  . Peripheral vascular disease [facility])     Past Surgical History:  Past Surgical History:  Procedure Laterality Date  . LIPOMA EXCISION N/A 09/09/2018   Procedure: Excision of 6 symptomatic glomus tumors;  Surgeon: Algernon Huxley, MD;  Location: [facility] ORS;  Service: General;  Laterality: N/A;  . MOLE REMOVAL  08/2009,09/2009   Biopsy compound Nevus w/slight-moderate melanocyte Atypia pubic region, excision for negative margins  . TONSILLECTOMY      Gynecologic History: No LMP recorded. (Menstrual  status: IUD).  Obstetric History: [id number]  Family History:  Family History  Problem Relation Age of Onset  . Breast cancer Mother 73  . Melanoma Mother 72       Malignant skin  . Breast cancer Maternal Aunt 50       and ovarian  . Breast cancer Maternal Aunt 60  . Ovarian cancer Maternal Aunt     Social History:  Social History   Socioeconomic History  . Marital status: Married    Spouse name: Not on file  . Number of children: Not on file  . Years of education: Not on file  . Highest education level: Not on file  Occupational History  . Not on file  Tobacco Use  . Smoking status: Never Smoker  . Smokeless tobacco: Never Used  Vaping Use  . Vaping Use: Never used  Substance and Sexual Activity  . Alcohol use: Not Currently  . Drug use: No  . Sexual activity: Yes    Birth control/protection: I.U.D.  Other Topics Concern  . Not on file  Social History Narrative  . Not on file   Social Determinants of Health   Financial Resource Strain:   . Difficulty of Paying Living Expenses:   Food Insecurity:   . Worried About Charity fundraiser in the Last Year:   . Arboriculturist in the Last Year:   Transportation Needs:   . Film/video editor (Medical):   Marland Kitchen Lack of Transportation (Non-Medical):   Physical Activity:   . Days of Exercise per Week:   .  Minutes of Exercise per Session:   Stress:   . Feeling of Stress :   Social Connections:   . Frequency of Communication with Friends and Family:   . Frequency of Social Gatherings with Friends and Family:   . Attends Religious Services:   . Active Member of Clubs or Organizations:   . Attends Archivist Meetings:   Marland Kitchen Marital Status:   Intimate Partner Violence:   . Fear of Current or Ex-Partner:   . Emotionally Abused:   Marland Kitchen Physically Abused:   . Sexually Abused:     Allergies:  Allergies  Allergen Reactions  . Azithromycin Anaphylaxis    Medications: Prior to Admission medications     Medication Sig Start Date End Date Taking? Authorizing Provider  Ascorbic Acid (VITAMIN C) 1000 MG tablet Take 1,000 mg by mouth daily.   Yes [provider]  Cholecalciferol (VITAMIN D3 PO) Take 7,500 Int'l Units by mouth daily.   Yes [provider]  levonorgestrel (MIRENA) 20 MCG/24HR IUD 1 each by Intrauterine route once.   Yes [provider]  Menaquinone-7 (VITAMIN K2 PO) Take 150 mcg by mouth daily.   Yes [provider]  phentermine (ADIPEX-P) 37.5 MG tablet Take 1 tablet (37.5 mg total) by mouth daily before breakfast. 06/08/20  Yes [name], MD  vitamin A 25000 UNIT capsule Take 25,000 Units by mouth daily.   Yes [provider]  MAGNESIUM MALATE PO Take 2 capsules by mouth daily.    [provider]  Misc Natural Products (ADRENAL PO) Take 2 capsules by mouth 2 (two) times daily.    [provider]  Probiotic Product (PROBIOTIC DAILY PO) Take 1 capsule by mouth daily.    [provider]    Physical Exam Height 5' 8"  (1.727 m), weight 195 lb (88.5 kg). Wt Readings from Last 3 Encounters:  07/09/20 195 lb (88.5 kg)  06/08/20 197 lb (89.4 kg)  05/10/20 207 lb (93.9 kg)  Body mass index is 29.65 kg/m.  No physical exam as this was a remote telephone visit to promote social distancing during the current COVID-19 Pandemic  Assessment: 44 y.o. [id number] follow up medical weight loss  Plan: Problem List Items Addressed This Visit    None    Visit Diagnoses    Overweight (BMI 25.0-29.9)    -  Primary   BMI 29.0-29.9,adult          1) 1500 Calorie ADA Diet  2) Patient education given regarding appropriate lifestyle changes for weight loss including: regular physical activity, healthy coping strategies, caloric restriction and healthy eating patterns.  3) Patient to take medication, with the benefits of appetite suppression and metabolism boost d/w pt, along with the side effects and risk factors of  long term use that will be avoided with our use of short bursts of therapy. Rx provided.    4) Telephone Time 5 minutes  5)  Return in about 4 weeks (around 08/06/2020) for F/U medication (phone).    [name], MD, [facility], [facility] 07/09/2020, 11:12 AM

## 2020-07-09 NOTE — Progress Notes (Signed)
No show

## 2020-08-06 ENCOUNTER — Ambulatory Visit: Payer: Managed Care, Other (non HMO) | Admitting: Obstetrics and Gynecology

## 2023-12-09 ENCOUNTER — Ambulatory Visit: Admit: 2023-12-09 | Payer: Managed Care, Other (non HMO) | Admitting: Gastroenterology

## 2023-12-09 ENCOUNTER — Ambulatory Visit: Payer: Managed Care, Other (non HMO)

## 2023-12-09 DIAGNOSIS — Z1211 Encounter for screening for malignant neoplasm of colon: Secondary | ICD-10-CM | POA: Diagnosis present

## 2023-12-09 DIAGNOSIS — K640 First degree hemorrhoids: Secondary | ICD-10-CM | POA: Diagnosis not present

## 2023-12-09 SURGERY — COLONOSCOPY WITH PROPOFOL
Anesthesia: General

## 2024-09-26 ENCOUNTER — Other Ambulatory Visit: Payer: Self-pay | Admitting: Obstetrics and Gynecology

## 2024-09-26 DIAGNOSIS — R928 Other abnormal and inconclusive findings on diagnostic imaging of breast: Secondary | ICD-10-CM

## 2024-09-29 ENCOUNTER — Other Ambulatory Visit: Payer: Self-pay | Admitting: Obstetrics and Gynecology

## 2024-09-29 ENCOUNTER — Inpatient Hospital Stay: Admission: RE | Admit: 2024-09-29 | Discharge: 2024-09-29 | Attending: Obstetrics and Gynecology

## 2024-09-29 ENCOUNTER — Ambulatory Visit
Admission: RE | Admit: 2024-09-29 | Discharge: 2024-09-29 | Disposition: A | Discharge status: Home / Self Care | Source: Ambulatory Visit | Attending: Obstetrics and Gynecology | Admitting: Obstetrics and Gynecology

## 2024-09-29 DIAGNOSIS — N6311 Unspecified lump in the right breast, upper outer quadrant: Secondary | ICD-10-CM

## 2024-09-29 DIAGNOSIS — R928 Other abnormal and inconclusive findings on diagnostic imaging of breast: Secondary | ICD-10-CM

## 2024-09-30 LAB — SURGICAL PATHOLOGY
